# Patient Record
Sex: Female | Born: 1970 | Race: White | Hispanic: No | Marital: Married | Smoking: Never smoker
Health system: Southern US, Community
[De-identification: ages and names within clinical notes are randomized; demographics above are authoritative.]

## PROBLEM LIST (undated history)

## (undated) DIAGNOSIS — M797 Fibromyalgia: Secondary | ICD-10-CM

## (undated) DIAGNOSIS — N3010 Interstitial cystitis (chronic) without hematuria: Secondary | ICD-10-CM

## (undated) HISTORY — PX: DILATION AND CURETTAGE OF UTERUS: SHX78

---

## 1998-01-14 ENCOUNTER — Inpatient Hospital Stay (HOSPITAL_COMMUNITY): Admission: AD | Admit: 1998-01-14 | Discharge: 1998-01-16 | Payer: Self-pay | Admitting: Obstetrics and Gynecology

## 1999-12-02 ENCOUNTER — Encounter: Payer: Self-pay | Admitting: Obstetrics and Gynecology

## 1999-12-02 ENCOUNTER — Inpatient Hospital Stay (HOSPITAL_COMMUNITY): Admission: AD | Admit: 1999-12-02 | Discharge: 1999-12-02 | Payer: Self-pay

## 1999-12-06 ENCOUNTER — Ambulatory Visit (HOSPITAL_COMMUNITY): Admission: RE | Admit: 1999-12-06 | Discharge: 1999-12-06 | Payer: Self-pay | Admitting: Obstetrics & Gynecology

## 2000-04-09 ENCOUNTER — Other Ambulatory Visit: Admission: RE | Admit: 2000-04-09 | Discharge: 2000-04-09 | Payer: Self-pay | Admitting: Obstetrics & Gynecology

## 2000-06-21 ENCOUNTER — Inpatient Hospital Stay (HOSPITAL_COMMUNITY): Admission: AD | Admit: 2000-06-21 | Discharge: 2000-06-21 | Payer: Self-pay | Admitting: Obstetrics & Gynecology

## 2000-09-27 ENCOUNTER — Inpatient Hospital Stay (HOSPITAL_COMMUNITY): Admission: AD | Admit: 2000-09-27 | Discharge: 2000-09-29 | Payer: Self-pay | Admitting: Obstetrics & Gynecology

## 2000-09-27 ENCOUNTER — Encounter: Payer: Self-pay | Admitting: Obstetrics and Gynecology

## 2000-10-29 ENCOUNTER — Inpatient Hospital Stay (HOSPITAL_COMMUNITY): Admission: AD | Admit: 2000-10-29 | Discharge: 2000-10-29 | Payer: Self-pay | Admitting: Obstetrics and Gynecology

## 2000-10-30 ENCOUNTER — Inpatient Hospital Stay (HOSPITAL_COMMUNITY): Admission: AD | Admit: 2000-10-30 | Discharge: 2000-11-02 | Payer: Self-pay | Admitting: Obstetrics and Gynecology

## 2002-08-05 HISTORY — PX: CHOLECYSTECTOMY: SHX55

## 2003-08-06 HISTORY — PX: ABDOMINAL HYSTERECTOMY: SHX81

## 2005-10-27 ENCOUNTER — Ambulatory Visit: Payer: Self-pay | Admitting: Oncology

## 2005-11-25 ENCOUNTER — Ambulatory Visit (HOSPITAL_COMMUNITY): Admission: RE | Admit: 2005-11-25 | Discharge: 2005-11-25 | Payer: Self-pay | Admitting: Obstetrics and Gynecology

## 2005-12-20 ENCOUNTER — Ambulatory Visit: Payer: Self-pay | Admitting: Oncology

## 2006-01-01 LAB — CBC WITH DIFFERENTIAL/PLATELET
BASO%: 0.4 % (ref 0.0–2.0)
EOS%: 1.1 % (ref 0.0–7.0)
Eosinophils Absolute: 0 10*3/uL (ref 0.0–0.5)
HCT: 42.2 % (ref 34.8–46.6)
MCH: 30.6 pg (ref 26.0–34.0)
MONO#: 0.3 10*3/uL (ref 0.1–0.9)
MONO%: 6.5 % (ref 0.0–13.0)
RBC: 4.7 10*6/uL (ref 3.70–5.32)
WBC: 4.3 10*3/uL (ref 3.9–10.0)

## 2006-01-01 LAB — COMPREHENSIVE METABOLIC PANEL
Alkaline Phosphatase: 58 U/L (ref 39–117)
Calcium: 9.3 mg/dL (ref 8.4–10.5)
Chloride: 105 mEq/L (ref 96–112)
Creatinine, Ser: 0.8 mg/dL (ref 0.4–1.2)
Glucose, Bld: 94 mg/dL (ref 70–99)
Potassium: 3.8 mEq/L (ref 3.5–5.3)
Sodium: 141 mEq/L (ref 135–145)
Total Bilirubin: 0.4 mg/dL (ref 0.3–1.2)
Total Protein: 7.2 g/dL (ref 6.0–8.3)

## 2006-01-01 LAB — CHCC SMEAR

## 2006-01-24 ENCOUNTER — Ambulatory Visit (HOSPITAL_BASED_OUTPATIENT_CLINIC_OR_DEPARTMENT_OTHER): Admission: RE | Admit: 2006-01-24 | Discharge: 2006-01-24 | Payer: Self-pay | Admitting: Urology

## 2006-03-05 ENCOUNTER — Ambulatory Visit (HOSPITAL_COMMUNITY): Admission: RE | Admit: 2006-03-05 | Discharge: 2006-03-06 | Payer: Self-pay | Admitting: Urology

## 2006-09-01 ENCOUNTER — Ambulatory Visit: Payer: Self-pay | Admitting: Emergency Medicine

## 2006-09-01 ENCOUNTER — Inpatient Hospital Stay (HOSPITAL_COMMUNITY): Admission: EM | Admit: 2006-09-01 | Discharge: 2006-09-15 | Payer: Self-pay | Admitting: Urology

## 2006-09-04 ENCOUNTER — Ambulatory Visit: Payer: Self-pay | Admitting: Oncology

## 2006-09-12 ENCOUNTER — Encounter (INDEPENDENT_AMBULATORY_CARE_PROVIDER_SITE_OTHER): Payer: Self-pay | Admitting: *Deleted

## 2006-09-15 ENCOUNTER — Ambulatory Visit: Payer: Self-pay | Admitting: Gastroenterology

## 2006-09-16 ENCOUNTER — Ambulatory Visit: Payer: Self-pay | Admitting: Oncology

## 2006-09-23 LAB — PROTIME-INR: INR: 1.4 — ABNORMAL LOW (ref 2.00–3.50)

## 2006-09-23 LAB — COMPREHENSIVE METABOLIC PANEL
ALT: 47 U/L — ABNORMAL HIGH (ref 0–35)
AST: 34 U/L (ref 0–37)
Alkaline Phosphatase: 89 U/L (ref 39–117)
Calcium: 9.2 mg/dL (ref 8.4–10.5)
Chloride: 105 mEq/L (ref 96–112)
Glucose, Bld: 85 mg/dL (ref 70–99)
Total Protein: 6.5 g/dL (ref 6.0–8.3)

## 2006-09-23 LAB — CBC WITH DIFFERENTIAL/PLATELET
BASO%: 0.3 % (ref 0.0–2.0)
HCT: 31.3 % — ABNORMAL LOW (ref 34.8–46.6)
HGB: 10.7 g/dL — ABNORMAL LOW (ref 11.6–15.9)
MCHC: 34.1 g/dL (ref 32.0–36.0)
MCV: 93.4 fL (ref 81.0–101.0)
Platelets: 148 10*3/uL (ref 145–400)
RBC: 3.35 10*6/uL — ABNORMAL LOW (ref 3.70–5.32)
RDW: 14.8 % — ABNORMAL HIGH (ref 11.3–14.5)

## 2006-09-23 LAB — LACTATE DEHYDROGENASE: LDH: 323 U/L — ABNORMAL HIGH (ref 94–250)

## 2006-10-09 ENCOUNTER — Ambulatory Visit: Payer: Self-pay | Admitting: Oncology

## 2006-10-23 LAB — PROTHROMBIN TIME: INR: 2.6 — ABNORMAL HIGH (ref 0.0–1.5)

## 2006-10-23 LAB — CBC WITH DIFFERENTIAL/PLATELET
Eosinophils Absolute: 0.1 10*3/uL (ref 0.0–0.5)
HGB: 12.3 g/dL (ref 11.6–15.9)
MCH: 31.4 pg (ref 26.0–34.0)
MCHC: 34.7 g/dL (ref 32.0–36.0)
MCV: 90.5 fL (ref 81.0–101.0)
MONO#: 0.2 10*3/uL (ref 0.1–0.9)
NEUT#: 1 10*3/uL — ABNORMAL LOW (ref 1.5–6.5)
NEUT%: 41.8 % (ref 39.6–76.8)

## 2006-10-28 LAB — HYPERCOAGULABLE PANEL, COMPREHENSIVE
Anticardiolipin IgA: <7 [APL'U] (ref <13)
DRVVT 1:1 Mix: 38.9 secs (ref 36.1–47.0)
DRVVT: 61.2 secs — ABNORMAL HIGH (ref 36.1–47.0)
Homocysteine: 4.9 umol/L (ref 4.0–15.4)
Protein C Activity: <15 % (ref 91–147)
Protein C, Total: 37 % — ABNORMAL LOW (ref 70–140)
Protein S Activity: 25 % — ABNORMAL LOW (ref 81–180)
Protein S Ag, Total: 46 % — ABNORMAL LOW (ref 70–140)

## 2006-10-28 LAB — COMPREHENSIVE METABOLIC PANEL
AST: 15 U/L (ref 0–37)
Albumin: 4.2 g/dL (ref 3.5–5.2)
Alkaline Phosphatase: 84 U/L (ref 39–117)
CO2: 25 mEq/L (ref 19–32)
Potassium: 4.1 mEq/L (ref 3.5–5.3)
Total Bilirubin: 0.3 mg/dL (ref 0.3–1.2)
Total Protein: 6.3 g/dL (ref 6.0–8.3)

## 2006-11-20 ENCOUNTER — Ambulatory Visit: Payer: Self-pay | Admitting: Oncology

## 2006-11-25 LAB — CBC WITH DIFFERENTIAL/PLATELET
Basophils Absolute: 0 10*3/uL (ref 0.0–0.1)
EOS%: 0.8 % (ref 0.0–7.0)
HCT: 33.8 % — ABNORMAL LOW (ref 34.8–46.6)
HGB: 12 g/dL (ref 11.6–15.9)
LYMPH%: 39 % (ref 14.0–48.0)
MCH: 31.5 pg (ref 26.0–34.0)
MCV: 89 fL (ref 81.0–101.0)
NEUT%: 53.9 % (ref 39.6–76.8)
Platelets: 106 10*3/uL — ABNORMAL LOW (ref 145–400)
lymph#: 1.4 10*3/uL (ref 0.9–3.3)

## 2006-11-25 LAB — PROTIME-INR

## 2006-11-25 LAB — COMPREHENSIVE METABOLIC PANEL
ALT: 17 U/L (ref 0–35)
CO2: 26 mEq/L (ref 19–32)
Calcium: 9.3 mg/dL (ref 8.4–10.5)
Chloride: 108 mEq/L (ref 96–112)
Creatinine, Ser: 0.67 mg/dL (ref 0.40–1.20)
Glucose, Bld: 99 mg/dL (ref 70–99)
Total Protein: 6.5 g/dL (ref 6.0–8.3)

## 2006-12-03 ENCOUNTER — Ambulatory Visit: Admission: RE | Admit: 2006-12-03 | Discharge: 2006-12-03 | Payer: Self-pay | Admitting: Oncology

## 2006-12-03 ENCOUNTER — Ambulatory Visit: Payer: Self-pay | Admitting: Vascular Surgery

## 2006-12-03 ENCOUNTER — Encounter: Payer: Self-pay | Admitting: Vascular Surgery

## 2006-12-26 LAB — CBC WITH DIFFERENTIAL/PLATELET
Basophils Absolute: 0 10*3/uL (ref 0.0–0.1)
EOS%: 1.4 % (ref 0.0–7.0)
HCT: 35.4 % (ref 34.8–46.6)
HGB: 12.3 g/dL (ref 11.6–15.9)
MCH: 32.6 pg (ref 26.0–34.0)
MCV: 93.9 fL (ref 81.0–101.0)
MONO%: 6.3 % (ref 0.0–13.0)
NEUT%: 53.5 % (ref 39.6–76.8)

## 2006-12-26 LAB — COMPREHENSIVE METABOLIC PANEL
ALT: 13 U/L (ref 0–35)
Albumin: 4.3 g/dL (ref 3.5–5.2)
BUN: 16 mg/dL (ref 6–23)
CO2: 27 mEq/L (ref 19–32)
Calcium: 8.9 mg/dL (ref 8.4–10.5)
Chloride: 107 mEq/L (ref 96–112)
Creatinine, Ser: 0.69 mg/dL (ref 0.40–1.20)
Potassium: 4.9 mEq/L (ref 3.5–5.3)

## 2006-12-26 LAB — PROTIME-INR

## 2007-01-26 ENCOUNTER — Ambulatory Visit: Payer: Self-pay | Admitting: Oncology

## 2007-01-29 LAB — CBC WITH DIFFERENTIAL/PLATELET
BASO%: 0.3 % (ref 0.0–2.0)
EOS%: 2.2 % (ref 0.0–7.0)
HCT: 32.2 % — ABNORMAL LOW (ref 34.8–46.6)
MCHC: 35 g/dL (ref 32.0–36.0)
MONO#: 0.3 10*3/uL (ref 0.1–0.9)
NEUT%: 46 % (ref 39.6–76.8)
RBC: 3.47 10*6/uL — ABNORMAL LOW (ref 3.70–5.32)
RDW: 12.1 % (ref 11.3–14.5)
WBC: 3.4 10*3/uL — ABNORMAL LOW (ref 3.9–10.0)
lymph#: 1.5 10*3/uL (ref 0.9–3.3)

## 2007-01-29 LAB — COMPREHENSIVE METABOLIC PANEL
BUN: 13 mg/dL (ref 6–23)
CO2: 28 mEq/L (ref 19–32)
Calcium: 9 mg/dL (ref 8.4–10.5)
Chloride: 106 mEq/L (ref 96–112)
Creatinine, Ser: 0.67 mg/dL (ref 0.40–1.20)

## 2007-01-29 LAB — PROTIME-INR
INR: 1.8 — ABNORMAL LOW (ref 2.00–3.50)
Protime: 21.6 Seconds — ABNORMAL HIGH (ref 10.6–13.4)

## 2007-02-12 ENCOUNTER — Ambulatory Visit: Payer: Self-pay | Admitting: Oncology

## 2007-03-17 ENCOUNTER — Ambulatory Visit (HOSPITAL_COMMUNITY): Admission: RE | Admit: 2007-03-17 | Discharge: 2007-03-18 | Payer: Self-pay | Admitting: Urology

## 2007-05-13 ENCOUNTER — Ambulatory Visit: Payer: Self-pay | Admitting: Oncology

## 2007-05-15 LAB — PROTIME-INR

## 2007-05-15 LAB — CBC WITH DIFFERENTIAL/PLATELET
BASO%: 0.4 % (ref 0.0–2.0)
Basophils Absolute: 0 10*3/uL (ref 0.0–0.1)
Eosinophils Absolute: 0.1 10*3/uL (ref 0.0–0.5)
HCT: 39.2 % (ref 34.8–46.6)
HGB: 14 g/dL (ref 11.6–15.9)
MONO#: 0.3 10*3/uL (ref 0.1–0.9)
NEUT#: 1.8 10*3/uL (ref 1.5–6.5)
NEUT%: 46.5 % (ref 39.6–76.8)
WBC: 3.8 10*3/uL — ABNORMAL LOW (ref 3.9–10.0)
lymph#: 1.6 10*3/uL (ref 0.9–3.3)

## 2007-05-16 LAB — COMPREHENSIVE METABOLIC PANEL
ALT: 16 U/L (ref 0–35)
CO2: 27 mEq/L (ref 19–32)
Chloride: 103 mEq/L (ref 96–112)
Sodium: 141 mEq/L (ref 135–145)
Total Bilirubin: 0.3 mg/dL (ref 0.3–1.2)
Total Protein: 6.9 g/dL (ref 6.0–8.3)

## 2007-05-16 LAB — TSH: TSH: 0.923 u[IU]/mL (ref 0.350–5.500)

## 2007-07-10 ENCOUNTER — Ambulatory Visit: Payer: Self-pay | Admitting: Oncology

## 2007-09-29 ENCOUNTER — Ambulatory Visit (HOSPITAL_BASED_OUTPATIENT_CLINIC_OR_DEPARTMENT_OTHER): Admission: RE | Admit: 2007-09-29 | Discharge: 2007-09-29 | Payer: Self-pay | Admitting: Urology

## 2008-12-25 IMAGING — CR DG CHEST 1V PORT
1 series · 1 of 1 positions shown · non-contrast
Comparison: none

CLINICAL DATA: Shortness of breath, fever

[view not recorded]
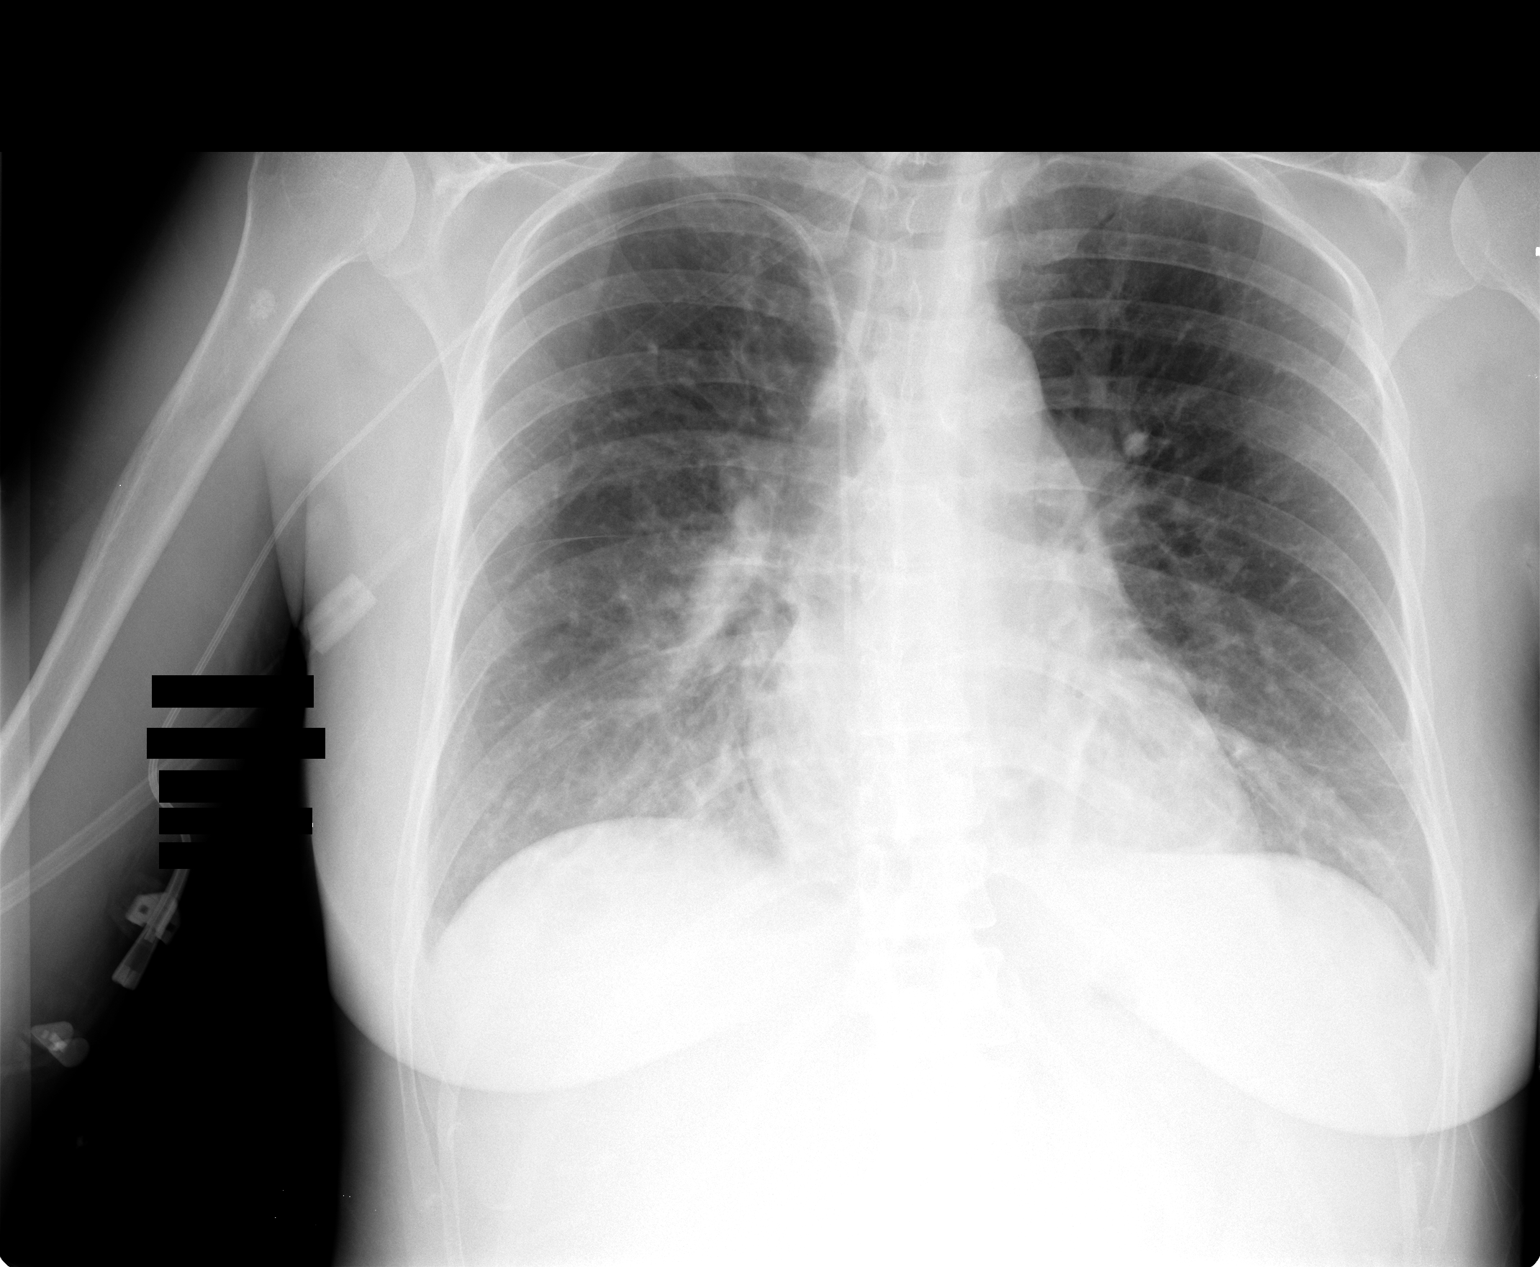

[1 of 1 positions shown; findings below may reference images not displayed]

Portable chest at 8468:

Comparison to previous day's exam. Right arm PICC stable. Some increase in
interstitial and air space opacities in the lung bases, right greater than left.
No definite effusion. Probable mild pulmonary vascular congestion. Heart size
remains normal. Vascular clips right upper abdomen.
IMPRESSION: 1. Worsening bibasilar infiltrates or edema, right greater than left.

## 2008-12-27 IMAGING — CR DG ABDOMEN ACUTE W/ 1V CHEST
3 series · 3 of 3 positions shown · non-contrast
Comparison: Chest x-ray 09/07/06.

CLINICAL DATA: Pelvic pain.
 ACUTE ABDOMINAL SERIES WITH CHEST - 3 VIEW:

[view not recorded (1 of 3)]
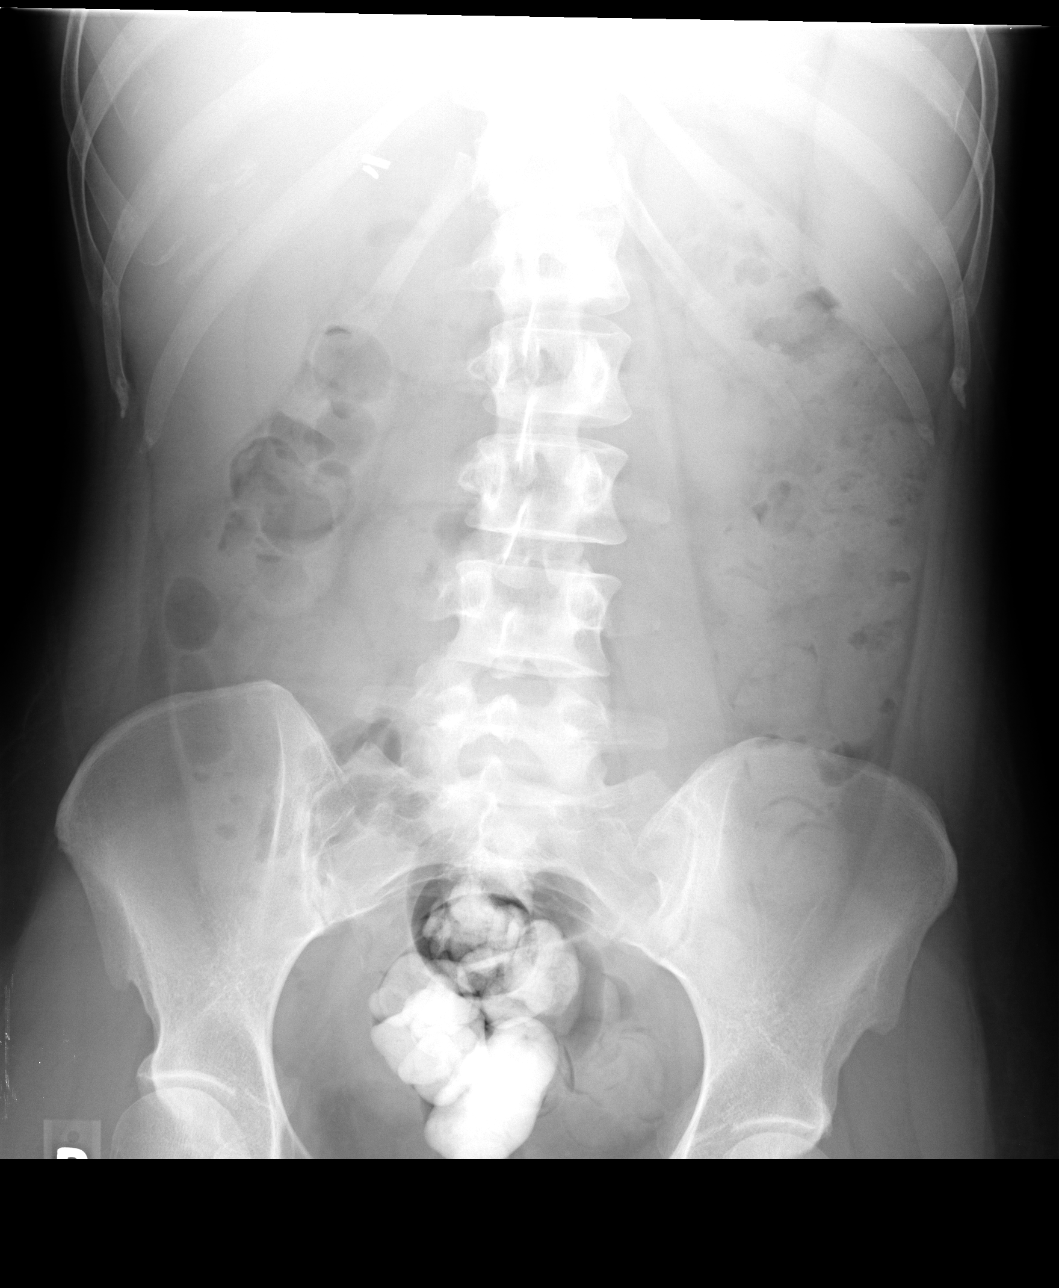

[view not recorded (2 of 3)]
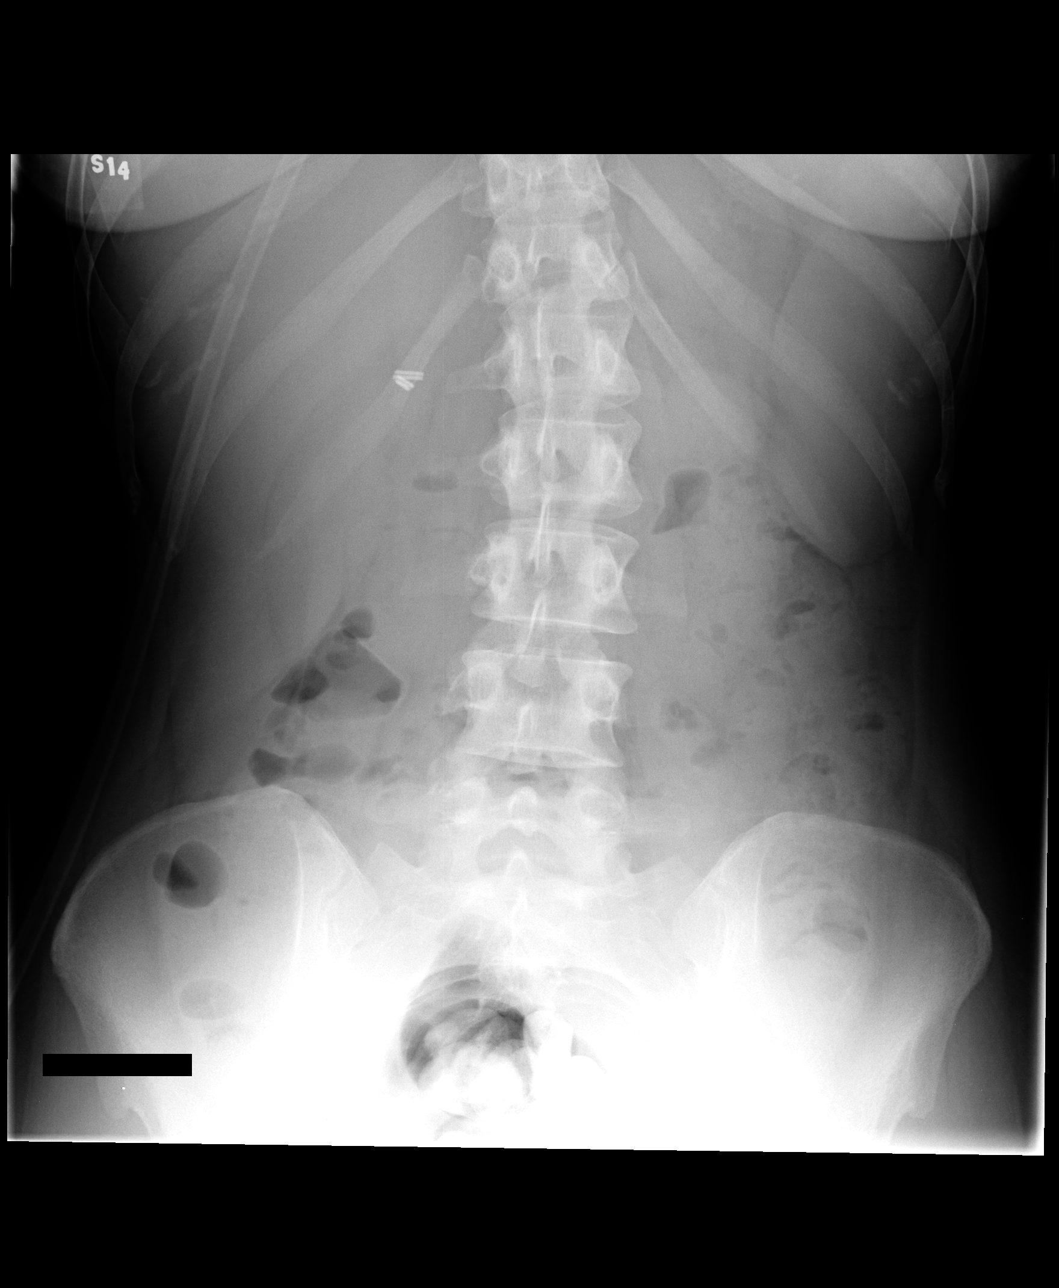

[view not recorded (3 of 3)]
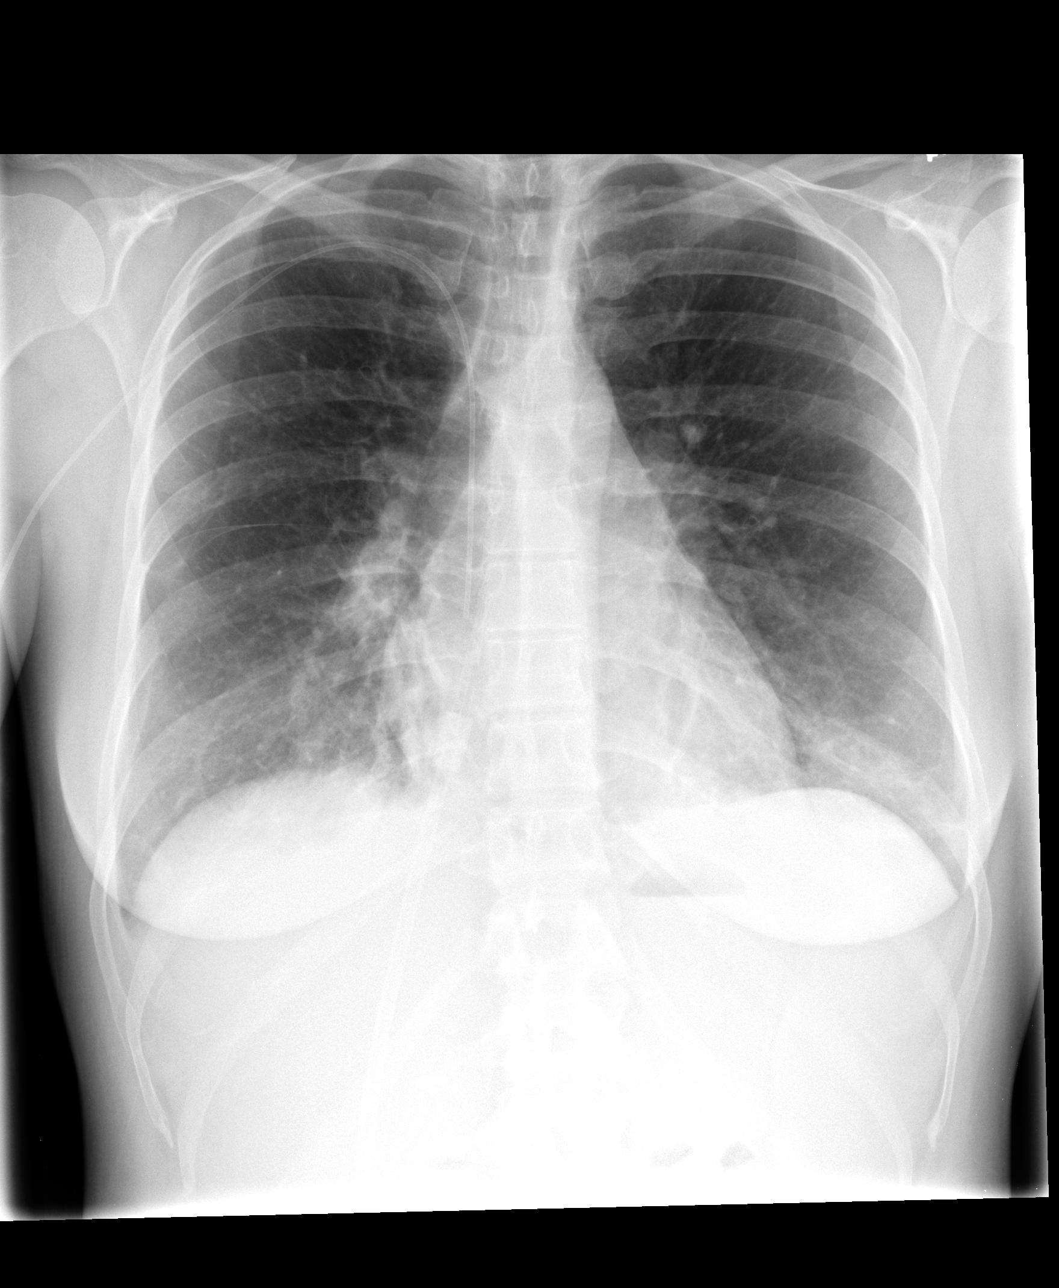

[3 of 3 positions shown; findings below may reference images not displayed]

FINDINGS: Bibasilar densities are still noted with perhaps slight interval improvement but little overall change.  A PICC line remains positioned near the cavoatrial junction. 
 No free air or specific abnormality of the bowel gas pattern. There is contrast in the rectum and sigmoid colon. This partially obscures an 18 mm bladder calculus.
 Psoas margins intact. Prior cholecystectomy.
IMPRESSION: 1.  Lung bases slightly improved. 
 2.  No acute or specific abdominal findings other than an 18 mm bladder calculus, noted on recent CT.

## 2008-12-29 IMAGING — CR DG CHEST 1V PORT
1 series · 1 of 1 positions shown · non-contrast
Comparison: 09/07/06.

CLINICAL DATA: Atelectasis.  Pelvic pain.
 PORTABLE CHEST - 1 VIEW - 09/11/06:

[view not recorded]
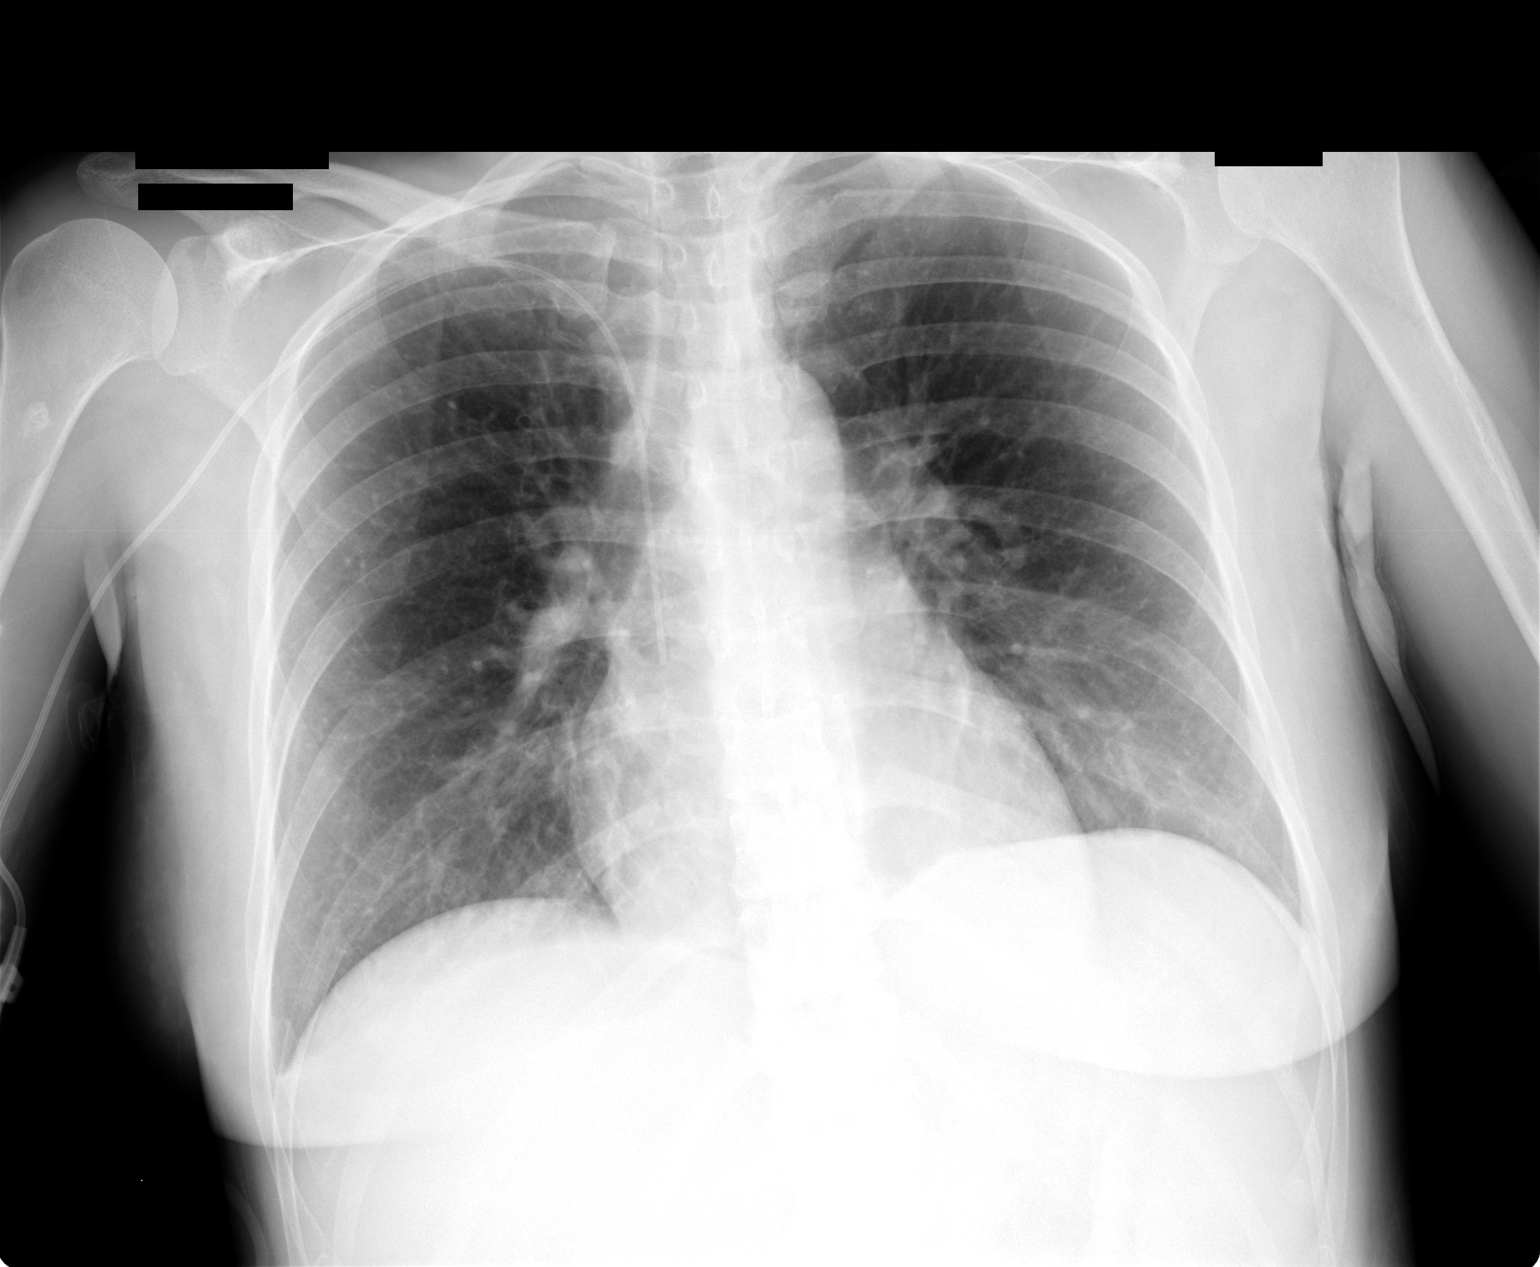

[1 of 1 positions shown; findings below may reference images not displayed]

FINDINGS: There is a right-sided PICC line with the tip in the SVC.  The heart size is normal.   There is no effusion or edema.  Atelectasis at both lung bases is improved in the interval.
IMPRESSION: 1.  Improved bibasilar atelectasis.
 2.  Stable right arm PICC line.

## 2009-08-05 HISTORY — PX: FACIAL FRACTURE SURGERY: SHX1570

## 2010-08-05 DIAGNOSIS — I2699 Other pulmonary embolism without acute cor pulmonale: Secondary | ICD-10-CM

## 2010-08-05 HISTORY — DX: Other pulmonary embolism without acute cor pulmonale: I26.99

## 2010-08-26 ENCOUNTER — Encounter (HOSPITAL_COMMUNITY): Payer: Self-pay | Admitting: Obstetrics and Gynecology

## 2010-12-18 NOTE — Op Note (Signed)
NAME:  Samantha [name], Samantha [name] NO.:  [id number]   MEDICAL RECORD NO.:  [id number]          PATIENT TYPE:  AMB   LOCATION:  [facility]                         FACILITY:  [facility]   PHYSICIAN:  Jamison Neighbor, M.D.  DATE OF BIRTH:  02-11-1971   DATE OF PROCEDURE:  09/29/2007  DATE OF DISCHARGE:                               OPERATIVE REPORT   PREOPERATIVE DIAGNOSIS:  Interstitial cystitis.   POSTOPERATIVE DIAGNOSIS:  Interstitial cystitis.   PROCEDURE:  Cystoscopy, urethral calibration, hydrodistention of  bladder, Marcaine and Pyridium installation, Marcaine and Kenalog  injection.   SURGEON:  Jamison Neighbor, M.D.   ANESTHESIA:  General.   COMPLICATIONS:  None.   DRAINS:  None.   BRIEF HISTORY:  This patient has end-stage interstitial cystitis with  severe and unrelenting pelvic pain.  The patient has had previous  hydrodistentions where she had lots of problems with stone formation.  She has undergone metabolic evaluation, has been on medication to try to  prevent stone formation.  She has also been on very aggressive medical  therapy but despite that has developed worsening problems with pelvic  floor dysfunction, urinary retention, chronic pain that is very poorly  controlled.  She has requested a repeat hydrodistention be performed.  She understands the risks and benefits of the procedure and gave full  informed consent.   PROCEDURE:  After successful induction of general anesthesia the patient  was placed in the dorsal lithotomy position, prepped with Betadine and  draped in the usual sterile fashion.  Bimanual examination revealed some  shortening of the vagina following previous hysterectomy.  There was not  much in the way of a cystocele.  There was a modest rectocele but  nothing that required therapy.  The urethra was calibrated to 32-French  with female urethral sounds with no signs of stenosis or stricture.  The  cystoscope was inserted.  The bladder was  carefully inspected.  No  tumors or stones could be seen.  Previous scarring from hydrodistention  and biopsies could be identified.  Hydrodistention of bladder was  performed.  The bladder was distended at a pressure of 100 cm of water  for five minutes.  When the bladder was drained, glomerulations could be  seen throughout the bladder was well as small ulcer formation.  Bladder  capacity little over 400 mL indicating marked decrease in the size of  the bladder.  The patient did not require a biopsy.  A mixture of Marcaine and  Pyridium was left in the bladder.  Marcaine and Kenalog were injected  periurethrally.  The patient tolerated the procedure well and was taken  to recovery room in good condition.      Jamison Neighbor, M.D.  Electronically Signed     RJE/MEDQ  D:  09/29/2007  T:  09/30/2007  Job:  [id number]

## 2010-12-18 NOTE — Op Note (Signed)
NAMEJANICE, [name] NO.:  [id number]   MEDICAL RECORD NO.:  [id number]          PATIENT TYPE:  OIB   LOCATION:  0098                         FACILITY:  [facility]   PHYSICIAN:  Jamison Neighbor, M.D.  DATE OF BIRTH:  1970-10-16   DATE OF PROCEDURE:  03/17/2007  DATE OF DISCHARGE:  03/18/2007                               OPERATIVE REPORT   PREOPERATIVE DIAGNOSIS:  1. Bladder calculi.  2. Bilateral flank pain with possible upper tract calculi.   POSTOPERATIVE DIAGNOSIS:  1. Bladder calculi.  2. Bilateral flank pain with possible upper tract calculi.   PROCEDURE:  Cystoscopy, bilateral retrogrades, cystolithalopaxy.   SURGEON:  Jamison Neighbor, M.D.   ANESTHESIA:  General.   COMPLICATIONS:  None.   DRAINS:  14-French Foley catheter.   BRIEF HISTORY:  This 40 year old female has a known bladder stone.  The  patient also has known interstitial cystitis along with numerous other  problems including fibromyalgia, irritable bowel syndrome and a recent  problem with deep venous thrombosis.  The patient has been on Coumadin  until just recently when it was stopped and we made arrangements for her  to have a scheduled procedure to remove her bladder stone.  The patient  called stating that she was developing upper tract pain and passing  stones and she thought that she might have stones coming from the  kidneys. For that reason, she is to undergo cystoscopy, bilateral  retrogrades, and at the same time undergo removal of the bladder  calculus.  She understands the risks and benefits of the procedure and  gave full informed consent.   DESCRIPTION OF PROCEDURE:  After successful induction of general  anesthesia, the patient was placed in the dorsal lithotomy position,  prepped with Betadine, and draped in the usual sterile fashion.  The  cystoscope was inserted.  The urethra was found to be of normal size  with no signs of stenosis or stricture.  The bladder was  carefully  inspected.  The large bladder stone was identified.  No other stones  could be seen.  There was nothing coming out of the ureters that was  irregular. The only positive findings was that the bladder mucosa had an  unremarkable appearance across its base due to the stone having sat in  that area for several months.  Bilateral retrogrades were then  performed.   On the right hand side, a ureteral catheter was inserted and a  retrograde was shot.  The ureters and collecting system was slightly  dilated but there was absolutely no filling defect and this appears to  be a variation of normal.  The drain out films taken later on were  unremarkable.  It was clear there was no tumor, stone or any  obstruction.  Attention was then directed the left hand side.  On the  left hand side, a ureteral catheter was also inserted.  This also showed  a slightly dilated system but there was no obstruction, no irregularity  on the drain out, and no sign of stone in the upper tract.  The patient  will be advised  that these were normal upper tracts.   Following completion, the patient underwent cystolithalopaxy using the  laser fiber. The stone was very slowly fragmented into small pieces all  of which were aspirated out of the bladder.  At the end of the  procedure, the only irregular finding at the bladder base was the area  that was somewhat denuded from the fact that the patient has had a stone  sitting in that area for several months. The remainder of the bladder  was absolutely normal.  There was no signs of other stone material in  the bladder and some of the previous adhered stone that had been seen at  prior cystoscopic examination was not present.  Great care was taken to  avoid over distending the bladder for fear of flaring up her known  interstitial cystitis and that problem did not occur.  The patient had a  Foley catheter inserted.  This was done in order to help the patient and   she is going to be allowed to stay overnight.  She will be discharged in  the morning.  The patient tolerated the procedure and was taken to  recovery in good condition.      Jamison Neighbor, M.D.  Electronically Signed     RJE/MEDQ  D:  03/17/2007  T:  03/19/2007  Job:  [id number]

## 2010-12-21 NOTE — Op Note (Signed)
[facility]  Patient:    Samantha [name], Samantha [name]                   MRN: [id number] Proc. Date: 12/06/99 Adm. Date:  [id number] Disc. Date: [id number] Attending:  Trevor Iha                           Operative Report  PREOPERATIVE DIAGNOSIS:  Missed abortion.  POSTOPERATIVE DIAGNOSIS:  Missed abortion.  OPERATIVE PROCEDURE:  Dilatation and evacuation.  SURGEON:  Freddy Finner, M.D.  ANESTHESIA:  General.  INTRAOPERATIVE COMPLICATIONS:  None.  ESTIMATED INTRAOPERATIVE BLOOD LOSS:  Less than or equal to 50 cc.  The patients blood type is known to be A-positive.  INDICATIONS:  The patient is a 40 year old white married female, gravida 2, para 1, who had some spotting and had an ultrasound approximately six days prior to this admission, which showed a viable fetus with a fetal heart rate of 108.  She presented with recurrent bleeding on the day prior to this surgery, at which time an ultrasound showed a nonviable pregnancy, with no evidence of fetal heart, and without even definite evidence of a fetal pole at that point.  She is now admitted for a D&E.  DESCRIPTION OF PROCEDURE:  She was admitted on the morning of surgery and brought to the operating room, and placed under adequate general anesthesia, placed in he dorsal lithotomy position.  A Betadine prep of the perineum and vagina was carried out.  A bivalve Graves speculum was introduced.  The cervix was grasped with the single tooth tenaculum and progressively dilated to 25 with Pratts.  A 7.0 mm curved suction cannula was introduced and aspiration produced obvious products f conception.  Gentle curettage was continued until it was felt that the cavity was completely evacuated.  This was confirmed by a repeat vacuum aspiration.  The patient tolerated the operative procedure well.  All instruments were removed. She was awakened and taken to the recovery room in good condition. DD:   12/06/99 TD:  12/06/99 Job: [id number]

## 2010-12-21 NOTE — Discharge Summary (Signed)
NAMEMarland Kitchen  Samantha [name], Samantha [name] NO.:  [id number]   MEDICAL RECORD NO.:  [id number]          PATIENT TYPE:  INP   LOCATION:  1432                         FACILITY:  [facility]   PHYSICIAN:  Jamison Neighbor, M.D.  DATE OF BIRTH:  [birth year]   DATE OF ADMISSION:  09/01/2006  DATE OF DISCHARGE:  09/15/2006                               DISCHARGE SUMMARY   DISCHARGE DIAGNOSES:  1. Interstitial cystitis.  2. Pulmonary embolism with infarction.  3. Pancytopenia.  4. Vulvodynia.  5. Irritable bowel syndrome.  6. Splenomegaly.  7. Migraine headaches.  8. Hypoxemia.  9. Bacteremia due to group D strep.   HISTORY:  This 40 year old female is known to have interstitial  cystitis.  The patient was diagnosed by cystoscopy and hydrodistention  which confirmed that diagnosis.  Since that time, the patient has had  multiple issues.  At first, she had some problems with bladder calculi  formation and required additional surgeries for removal of the stones  plus fulguration.  It has been extremely difficult to keep the patient's  pain under control.  The patient is being admitted for pain control and  additional evaluation.   The patient is known to have irritable bowel syndrome, esophageal reflux  disease and also a past history of endometriosis.  There is also vulvar  pain.  Previous surgery includes hydrodistention, removal of bladder  calculi and also hysterectomy.  The patient social history is  unremarkable.  She does not use tobacco or alcohol.  Her initial  physical examination is delineated in the initial history and physical.   HOSPITAL COURSE:  The patient had a very complicated hospital situation.  She was first placed on round-the-clock pain medication plus a Foley  catheter irrigation of the bladder.  In order to ensure that there was  not something else going on aside from the interstitial cystitis, a CT  scan was obtained and this showed evidence of a pulmonary embolus.   The  etiology of this was unclear as the patient had no clinical signs of  lower extremity clot.  She ended up being evaluated further with a  formal chest CT which confirmed the diagnosis of PE and also with a  venous Doppler which was unremarkable.  The patient continued on her  pain medication which included a PCA, Toradol and Lyrica along with her  bladder installation.  Her laboratory studies were normal with the  exception of a low total protein.  The patient was started on  anticoagulation therapy and hematology/oncology was consulted because  there was a question about pancytopenia.  It was noted that there was a  decrease in her platelet count.  We also noted that she had some  splenomegaly.  They consulted her and we also had pulmonary consult with  the patient in order to see if they had any information as to why the  patient might have had a PE.  Hematology was concerned as to that she  might have HIT.  Eventually, they did agree to go ahead and perform a  bone marrow aspirate which came back negative.  The patient did  have a  temperature spike to 102 degrees.  She was on Levaquin for what was  thought to be an enterococcus urinary tract infection.  Blood cultures  were negative.  Etiology of this CT scan was never completely clear.   The temperature spike was thought to possibly be due to pneumonia.  It  should be noted during the evaluation it was found that the patient did  have a bladder stone.  The patient did develop some peripheral edema  that required treatment with Lasix.   Pulmonary and occupational therapy consuls were obtained to see if the  patient could improve in her ambulation.  The patient was very slowly  mobilized and was eventually ready for discharge.   By the time the patient was ready, her situation was as follows:  1. There were still some questions as to why the patient had a      pulmonary embolism in whether she actually had an issue with her       coagulation status.  2. There were certainly some concerns about the infiltrates seen on CT      and why she had a pulmonary embolus.  3. The patient's splenomegaly and elevated liver function tests were      unclear and that certainly may be due to medical therapy.  4. Obviously, it is very difficult to control the patient's pelvic      pain.  5. The patient was clearly deconditioned.   The GI consultation suggested that the patient really did not have a  primary liver disease and did not feel she had any significance other  than irritable bowel syndrome.   The patient will continue anticoagulation for a full 6 months.  We will  maintain her on her interstitial cystitis therapy for her bladder which  will consist of installation therapy, oral therapy, etc.  She is to  return to see me in the off in two week's time.           ______________________________  Jamison Neighbor, M.D.  Electronically Signed     RJE/MEDQ  D:  10/18/2006  T:  10/19/2006  Job:  [id number]

## 2010-12-21 NOTE — Op Note (Signed)
NAMEMarland Kitchen  BUSHRA, DENMAN NO.:  [id number]   MEDICAL RECORD NO.:  [id number]          PATIENT TYPE:  INP   LOCATION:  1610                         [facility]ACILITY:  [facility]   PHYSICIAN:  Jamison Neighbor, M.D.  DATE OF BIRTH:  05-Oct-1970   DATE OF PROCEDURE:  03/05/2006  DATE OF DISCHARGE:  03/06/2006                                 OPERATIVE REPORT   PREOPERATIVE DIAGNOSIS:  Multiple bladder calculi, status post recent  cystoscopy.   POSTOPERATIVE DIAGNOSIS:  Multiple bladder calculi, status post recent  cystoscopy.   PROCEDURE:  Removal of multiple bladder calculi with fulguration of bladder  ulcer.   SURGEON:  Jamison Neighbor, M.D.   ANESTHESIA:  General.   COMPLICATIONS:  None.   DRAINS:  None.   BRIEF HISTORY:  This 40 year old female underwent a recent cystoscopy and  hydrodistention for evaluation of what was presumed to be interstitial  cystitis.  She was found to have a small bladder capacity consistent with  IC.  The patient began to develop some problems with hematuria and some  passing calculi.  The patient underwent a cystoscopic examination in the  office, and multiple bladder calculi were seen adhered to a lot of little,  small spots all over the bladder.  There were at least a dozen small stones  within the bladder which are really bothering the patient.  She is now to  undergo removal of the calculi with fulguration of the ulcerated areas, if  possible.  She understands the risks and benefits of proceeding and gave  full, informed consent.   PROCEDURE:  After successful induction of general anesthesia, the patient  was placed in the dorsal lithotomy position, prepped with Betadine, and  draped in the usual sterile fashion.  The cystoscope was inserted, and the  bladder was carefully inspected.  Stones were seen scattered throughout the  bladder, actually embedded into the mucosa where the area was healing.  It  is certainly possible that where  these raw areas developed following the  recent cystoscopy that the patient developed calculi, perhaps due to a  previously undetermined high lithogenic potential within the urine.  These  stones were all individually grasped and removed, and all the grit was then  washed out of the bladder as much as possible.  Areas that were raw and  bleeding were fulgurated.  The patient tolerated the procedure well and was  taken to the recovery room in good condition.  While we are waiting to get a  24-hour urine study, we are going to empirically place her on  hydrochlorothiazide, allopurinol, and Urocit 10 to try and prevent  stone formation, if at all possible, but the patient will be advised that  she certainly is at some risk for stone formation until the bladder has  fully healed.  The patient will continue on her other medications and will  also be given doxycycline.           ______________________________  Jamison Neighbor, M.D.  Electronically Signed     RJE/MEDQ  D:  03/05/2006  T:  03/06/2006  Job:  [id number]

## 2010-12-21 NOTE — Op Note (Signed)
NAMEMarland Kitchen  Samantha, [name] NO.:  [id number]   MEDICAL RECORD NO.:  [id number]          PATIENT TYPE:  AMB   LOCATION:  [facility]                         FACILITY:  [facility]   PHYSICIAN:  Jamison Neighbor, M.D.  DATE OF BIRTH:  Sep 18, 1970   DATE OF PROCEDURE:  01/24/2006  DATE OF DISCHARGE:                                 OPERATIVE REPORT   PREOPERATIVE DIAGNOSES:  Interstitial cystitis/painful bladder syndrome.   POSTOPERATIVE DIAGNOSES:  Interstitial cystitis/painful bladder syndrome.   PROCEDURE:  Cystoscopy, urethral calibration, hydro-distention of the  bladder, Marcaine and Pyridium installation, Marcaine and Kenalog injection.   SURGEON:  Jamison Neighbor, M.D.   ANESTHESIA:  General.   COMPLICATIONS:  None.   DRAINS:  None.   INDICATIONS FOR PROCEDURE:  This is a 40 year old female who has had  problems with chronic lower urinary tract symptoms and pelvic pain.  She is  status post a hysterectomy for endometriosis, but did not have any real  improvement in her pelvic pain.  For that reason she is undergoing a  diagnostic cystoscopy to determine if she might have interstitial cystitis.  The patient understands the risks and benefits of the procedure.  She is  fully aware of the fact that there is no guaranty that this will give her  improvement in her symptoms but it is hoped that she may be one of that  percentage of patients who does improve with hydrodistention, even if only  temporarily.  The patient is aware that this is being primarily done for  diagnostic purposes.  She gave a full informed consent.   DESCRIPTION OF PROCEDURE:  After a successful induction of general  anesthesia, the patient was placed in the dorsal lithotomy position and  prepped with Betadine and draped in the usual sterile fashion.  The patient  underwent a bimanual examination.  She has good support for the vaginal  vault.  There is no cystocele, rectocele or enterocele.  There are  no  masses.  On bimanual examination the urethra was palpably normal with no  signs of a diverticulum.  The cystoscope was inserted.  The bladder was  carefully inspected.  It was free of any tumors or stones.  Both orifices  were normal in configuration and location.  At one point there had been a  question on a CT scan from [facility] that she might have air, and I  carefully dilated the bladder to make sure there was no evidence of a  fistula and nothing was identified.  The bladder was distended at a pressure  of 170 mm of water for five minutes.  When the bladder was drained, the  patient had classic glomerulizations throughout the bladder, consistent with  interstitial cystitis.  The bladder capacity was diminished with a total  volume of 650 mL, which is a little bit larger than the average IC bladder  of 575 mL, but significantly smaller than a normal bladder capacity in a  female of her age, which is 1150 mL.  The patient did not have any Hunner's  ulcers, but did have the classic glomerulizations consistent with  interstitial cystitis.  Biopsy was not indicated.  The bladder was drained.  A mixture of Marcaine and Pyridium was left in the bladder.  Marcaine and  Kenalog were injected peri-urethrally.  The patient received an  intraoperative B&O suppository as well as Toradol and Zofran.   She will be sent home with a prescription for Lorcet plus, Pyridium plus and  doxycycline.  She will return to see [place] in followup in two to three weeks'  time.  At that point, we will start her on installation therapy as well as  oral therapy and hopefully get her interstitial cystitis under reasonable  control.           ______________________________  Jamison Neighbor, M.D.  Electronically Signed     RJE/MEDQ  D:  01/24/2006  T:  01/24/2006  Job:  [id number]   cc:   Zelphia Cairo, MD  Fax: [phone]

## 2010-12-21 NOTE — Consult Note (Signed)
NAMEMarland Kitchen  Samantha [name], Samantha [name]            ACCOUNT NO.:  [id number]   MEDICAL RECORD NO.:  [id number]          PATIENT TYPE:  INP   LOCATION:  1432                         FACILITY:  [facility]   PHYSICIAN:  Firas N. Shadad        DATE OF BIRTH:  1970/11/09   DATE OF CONSULTATION:  09/04/2006  DATE OF DISCHARGE:                                 CONSULTATION   REASON FOR CONSULTATION:  Thrombocytopenia, pulmonary embolus, low white  cell counts, and splenomegaly.   HISTORY OF PRESENT ILLNESS:  This is a very pleasant 40 year old female  who lives in [place], whom I have seen once in consultation back in May  2007.  Samantha [name] is a very pleasant, however, unfortunate female with  chronic history of interstitial cystitis, irritable bowel syndrome, and  recurrent endometriosis.  She has had multiple hospitalizations in the  past for flares of endometriosis as well as gastroenteritis as well as  interstitial cystitis.  When I saw her back in May 2007, the reason was  for thrombocytopenia.  At that time, she has had a recent  hospitalization in March 2007, with episode of gastroenteritis and led  to a hospitalization in [place], and her platelet counts at the time of  admission were 32 and subsequently dropped to 70,000.  The patient was  seen at that time by Dr. Rennis Harding at [facility] for  thrombocytopenia and felt it was a rather benign finding.  The patient  never really followed up with him and ended up seeing me in clinic back  in May 2007.  At that time, her platelet count at [facility] here in [place], her platelet count was 154,000.  Her  peripheral smear at that time did not show any abnormalities findings,  did not show any histocytes or evidence of xerocytosis or any abnormal  findings.  In looking back at her history, she had a fluctuation in her  platelet counts dating back to at least March 2002 where her platelet  counts ranged between the 70,000  and 80,000 range.  Her drop in her  platelet counts have always been correlated with an acute illness and a  hospitalization every time.  This time around, the patient presented to  Dr. Logan Bores for chronic pelvic pain and more flare of her interstitial  cystitis as well as multiple bladder caliculi and ended up being  hospitalized on January 28 by Dr. Logan Bores for pain management at that  time.  Upon admission, she was noted to have her platelets to be at  70,000 and subsequently __________ dropped to 54,000 on January 31.  Her  total white cell count was also at 2.2., her hemoglobin 11.2.  The  patient also had a CT scan of the abdomen and pelvis on January 28 and  had an incidental finding of a probable right lower pulmonary embolus.  They are also describing in her CT scan a borderline enlarged spleen  that has been stable compared to a scan on April 2007.  The patient  subsequently started on heparin later on September 02, 2006.  Since that  time, she has been receiving heparin as mentioned.  The patient control  analgesia utilizing Dilaudid.  Overall, Samantha [name] feels relatively  okay, has not really reported any bleeding anywhere and did not report  any hematuria or hematochezia, did not report any hemoptysis or  hematemesis.   REVIEW OF SYSTEMS:  Other than the pelvic pain, as mentioned above, it  was unremarkable.   PAST MEDICAL HISTORY:  1. Irritable bowel syndrome.  2. History of endometriosis.  3. She is also status post cholecystectomy, hysterectomy, and      bilateral salpingo-oophorectomy.  4. There is no history of any blood clots or any bleeding disorders.   FAMILY HISTORY:  Significant for endometriosis, also hypertension.  Grandmother has breast cancer.   SOCIAL HISTORY:  She is married, stay at home mother.  She has 3  children.   MEDICATIONS AS AN OUTPATIENT:  She is on Bentyl, Prevacid, Zofran, and  Phenergan.   MEDICATION IN THE HOSPITAL:  Ciprofloxacin, Valium,  heparin drip,  Dilaudid drip, Atarax, Toradol, Pyridium, Lyrica, Bentyl, Zofran, and  Ditropan   ALLERGIES:  DEMEROL, MORPHINE, ERYTHROMYCIN.   PHYSICAL EXAMINATION:  GENERAL:  Alert, awake female, did not appear in  any active distress.  VITAL SIGNS:  Blood pressure 129/79, pulse 87, respirations 18,  saturating 99% on room air.  Her T-max is 100.8.  HEENT:  Head is normocephalic, atraumatic.  Pupils equal, round, and  reactive to light.  Mucous membranes moist and pink.  NECK:  Supple, no lymphadenopathy.  HEART:  Regular rate and rhythm, S1 and S2.  LUNGS:  Clear to auscultation.  No rhonchi or wheezes.  ABDOMEN:  Soft.  I could not appreciate any splenomegaly.  EXTREMITIES:  No clubbing, cyanosis, or edema.  NEUROLOGIC:  Intact.   LABORATORY DATA:  Hemoglobin 10.8, white cells 2.4, platelet count  54,000.  Differential is pending.  Her PTT is 15.3.  INR is 1.3.  Her  heparin level is 0.36.   ASSESSMENT/PLAN:  This is a 40 year old female with the following  issues:  1. History of interstitial cystitis.  2. 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  3. Leukopenia.  4. Acute pulmonary embolus incidentally found.  5. Borderline splenomegaly.   RECOMMENDATION AND PLAN:  I think from a thrombocytopenia standpoint  again, as mentioned, her platelets have been up and down since 2002 and,  again, it could be related to her illness.  I think her thrombocytopenia  has been related to her acute illness.  Also, she could have an element  of immune thrombocytopenia.  It possible that her immune  thrombocytopenia is related to her increased spleen size.  I doubt that  she has a primary hematological problem; however, I am not ruling this  out completely at this point.  So, to work this up, I will obtain a repeat CBC with differential at this time with a peripheral smear which  I will  review.  I do agree with a disseminated intravascular coagulation  panel as well as a HIT panel; however, I think both of those are  extremely unlikely.  In this setting, it is possible that she has a low-  grade disseminated intravascular coagulation that is manifesting with a  clot rather than a bleed.  I think a disseminated intravascular  coagulation panel would clarify that.  Overall, I think her picture is  rather  complex.  For the time being, I do agree with continuing heparin  at this point given the fact it is really manifesting itself with more  clotting than bleeding problem.  I will continue to watch her daily CBC  at this point.           ______________________________  Blenda Nicely. [facility]  Electronically Signed     FNS/MEDQ  D:  09/04/2006  T:  09/04/2006  Job:  [id number]

## 2010-12-21 NOTE — H&P (Signed)
NAMEMarland Kitchen  Samantha [name], Samantha [name] NO.:  [id number]   MEDICAL RECORD NO.:  [id number]          PATIENT TYPE:  INP   LOCATION:  1432                         FACILITY:  [facility]   PHYSICIAN:  Jamison Neighbor, M.D.  DATE OF BIRTH:  Feb 06, 1971   DATE OF ADMISSION:  09/01/2006  DATE OF DISCHARGE:                              HISTORY & PHYSICAL   SERVICE:  Urology.   ADMITTING DIAGNOSIS:  1. End-stage interstitial cystitis.  2. Uncontrolled chronic pelvic pain.   HISTORY:  This is a 40 year old female who is known to have interstitial  cystitis.  In June of last year, the patient underwent cystoscopy and  hydrodistention of the bladder which confirmed the diagnosis of  interstitial cystitis.  The patient developed severe problems with stone  formation along all of the raw surface of the bladder had to undergo  additional surgery for removal of multiple bladder calculi and  fulguration of bladder ulcers.  Since that time, it has been almost  impossible to keep her pain under control.  She has had intermittent  problems with irregular urinalyses and severe pelvic pain.  She has been  placed on medication to try and control her stone formation, and that  has improved her overall situation, but it has become extremely  difficult to control her pain.  The patient is not a candidate for  repeat hydrodistention as she would be at risk for repeat stone  formation.  She is now to be admitted for pain control and additional  evaluation.   The patient's past medical history is also remarkable for irritable  bowel syndrome and problems with gastritis.  She also has a history  endometriosis for which she underwent hysterectomy.  The problem, of  course, is that the combination of endometriosis, irritable bowel  syndrome and interstitial cystitis has caused severe and chronic pelvic  pain that has been very difficult to control necessitating the need for  hospitalization.   PREVIOUS  SURGERY:  Includes the hysterectomy in 2006, the initial  hydrodistention in 2007 and the removal of bladder calculi later in  2007.   The patient's family history is unremarkable.  She does not use tobacco.  She does not use alcohol.  She has an extremely supportive husband and  family.   FAMILY HISTORY AND SOCIAL HISTORY AND REVIEW OF SYSTEMS:  Noncontributory.   PHYSICAL EXAMINATION:  GENERAL:  The patient is a well-developed, well-  nourished female with severe pelvic pain.  HEENT:  Normocephalic, atraumatic.  Cranial nerves II-XII are grossly  intact.  NECK:  Supple.  No adenopathy or thyromegaly.  LUNGS:  Clear.  HEART:  Had a regular rate and rhythm.  No murmurs, thrills, gallops,  rubs, or heaves.  ABDOMEN:  Soft but very tender across the pubic bone.  PELVIS:  There is a lot of pelvic floor dysfunction for which she has  been treated with a physical therapist as well as home inferential  stimulator.  She does not have cystocele or rectocele.  There are no  masses on bimanual exam.  The bladder is very tender.  EXTREMITIES:  Have no  cyanosis, clubbing, or edema.   IMPRESSION:  1. History of endometriosis.  2. Irritable bowel syndrome.  3. Interstitial cystitis.  4. History of bladder calculi.  5. Severe chronic pelvic pain.   PLAN:  Admit for IV pain management, epidural anesthesia, and additional  evaluation.           ______________________________  Jamison Neighbor, M.D.  Electronically Signed     RJE/MEDQ  D:  09/01/2006  T:  09/02/2006  Job:  [id number]

## 2010-12-21 NOTE — Op Note (Signed)
NAMEMarland Kitchen  [name], [name]            ACCOUNT NO.:  [id number]   MEDICAL RECORD NO.:  [id number]          PATIENT TYPE:  INP   LOCATION:  1432                         [facility]AC[facility]   PHYSICIAN:  Firas N. Shadad        DATE OF BIRTH:  03-27-1971   DATE OF PROCEDURE:  09/12/2006  DATE OF DISCHARGE:                               OPERATIVE REPORT   DESCRIPTION OF PROCEDURE:  Bone marrow aspirate and biopsy.   INDICATION:  Ms. Samantha [name] is a 40 year old female with  thrombocytopenia, leukopenia, and splenomegaly.  A bone marrow biopsy is  to rule out a lympho or myeloproliferative disorder.   The patient was prepped and draped in a sterile fashion and placed in  the decubitus position, exposing her left iliac crest.  The skin was  prepped and draped using Betadine.  Skin was anesthetized using 2%  lidocaine as well as the periosteum.  The aspirate needle was used to  obtain aspirate without any difficulty.  The fluid was sent for  cytogenetics and flow cytometry.  Attempted biopsy was aborted due to  the patient's intolerance and pain.  There were no bleeding  complications.  The patient the procedure overall well.           ______________________________  Blenda Nicely. [facility]  Electronically Signed     FNS/MEDQ  D:  09/12/2006  T:  09/12/2006  Job:  [id number]

## 2011-04-26 LAB — I-STAT 8, (EC8 V) (CONVERTED LAB)
BUN: 18
Bicarbonate: 29.1 — ABNORMAL HIGH
Glucose, Bld: 91
HCT: 48 — ABNORMAL HIGH
Hemoglobin: 16.3 — ABNORMAL HIGH
Operator id: 114531
Potassium: 3.5
Sodium: 140
TCO2: 31

## 2011-05-20 LAB — HEMOGLOBIN AND HEMATOCRIT, BLOOD: HCT: 34.4 — ABNORMAL LOW

## 2012-08-05 HISTORY — PX: SHOULDER ARTHROSCOPY: SHX128

## 2021-12-12 ENCOUNTER — Other Ambulatory Visit: Payer: Self-pay | Admitting: Neurological Surgery

## 2021-12-13 NOTE — Pre-Procedure Instructions (Signed)
Surgical Instructions ? ? ? Your procedure is scheduled on Tuesday, May 16th. ? Report to [facility] Main Entrance "A" at 05:30 A.M., then check in with the Admitting office. ? Call this number if you have problems the morning of surgery: ? [phone] ? ? If you have any questions prior to your surgery date call [phone]: Open Monday-Friday 8am-4pm ? ? ? Remember: ? Do not eat after midnight the night before your surgery ? ?You may drink clear liquids until 04:30 AM the morning of your surgery.   ?Clear liquids allowed are: Water, Non-Citrus Juices (without pulp), Carbonated Beverages, Clear Tea, Black Coffee Only (NO MILK, CREAM OR POWDERED CREAMER of any kind), and Gatorade. ?  ? Take these medicines the morning of surgery with A SIP OF WATER  ?gabapentin (NEURONTIN)  ?oxymetazoline (AFRIN) 0.05 % nasal spray- if needed ? ? ?As of today, STOP taking any Aspirin (unless otherwise instructed by your surgeon) Aleve, Naproxen, Ibuprofen, Motrin, Advil, Goody's, BC's, all herbal medications, fish oil, and all vitamins. ?         ?           ?Do NOT Smoke (Tobacco/Vaping) for 24 hours prior to your procedure. ? ?If you use a CPAP at night, you may bring your mask/headgear for your overnight stay. ?  ?Contacts, glasses, piercing's, hearing aid's, dentures or partials may not be worn into surgery, please bring cases for these belongings.  ?  ?For patients admitted to the hospital, discharge time will be determined by your treatment team. ?  ?Patients discharged the day of surgery will not be allowed to drive home, and someone needs to stay with them for 24 hours. ? ?SURGICAL WAITING ROOM VISITATION ?Patients having surgery or a procedure may have two support people in the waiting room. These visitors may be switched out with other visitors if needed. ?Children under the age of 42 must have an adult accompany them who is not the patient. ?If the patient needs to stay at the hospital during part of their recovery, the  visitor guidelines for inpatient rooms apply. ? ?Please refer to the [place] website for the visitor guidelines for Inpatients (after your surgery is over and you are in a regular room).  ? ? ?Special instructions:   ?[facility]- Preparing For Surgery ? ?Before surgery, you can play an important role. Because skin is not sterile, your skin needs to be as free of germs as possible. You can reduce the number of germs on your skin by washing with CHG (chlorahexidine gluconate) Soap before surgery.  CHG is an antiseptic cleaner which kills germs and bonds with the skin to continue killing germs even after washing.   ? ?Oral Hygiene is also important to reduce your risk of infection.  Remember - BRUSH YOUR TEETH THE MORNING OF SURGERY WITH YOUR REGULAR TOOTHPASTE ? ?Please do not use if you have an allergy to CHG or antibacterial soaps. If your skin becomes reddened/irritated stop using the CHG.  ?Do not shave (including legs and underarms) for at least 48 hours prior to first CHG shower. It is OK to shave your face. ? ?Please follow these instructions carefully. ?  ?Shower the NIGHT BEFORE SURGERY and the MORNING OF SURGERY ? ?If you chose to wash your hair, wash your hair first as usual with your normal shampoo. ? ?After you shampoo, rinse your hair and body thoroughly to remove the shampoo. ? ?Use CHG Soap as you would any other liquid soap.  You can apply CHG directly to the skin and wash gently with a scrungie or a clean washcloth.  ? ?Apply the CHG Soap to your body ONLY FROM THE NECK DOWN.  Do not use on open wounds or open sores. Avoid contact with your eyes, ears, mouth and genitals (private parts). Wash Face and genitals (private parts)  with your normal soap.  ? ?Wash thoroughly, paying special attention to the area where your surgery will be performed. ? ?Thoroughly rinse your body with warm water from the neck down. ? ?DO NOT shower/wash with your normal soap after using and rinsing off the CHG  Soap. ? ?Pat yourself dry with a CLEAN TOWEL. ? ?Wear CLEAN PAJAMAS to bed the night before surgery ? ?Place CLEAN SHEETS on your bed the night before your surgery ? ?DO NOT SLEEP WITH PETS. ? ? ?Day of Surgery: ?Take a shower with CHG soap. ?Do not wear jewelry or makeup ?Do not wear lotions, powders, perfumes, or deodorant. ?Do not shave 48 hours prior to surgery.   ?Do not bring valuables to the hospital.  ?[facility] is not responsible for any belongings or valuables. ?Do not wear nail polish, gel polish, artificial nails, or any other type of covering on natural nails (fingers and toes) ?If you have artificial nails or gel coating that need to be removed by a nail salon, please have this removed prior to surgery. Artificial nails or gel coating may interfere with anesthesia's ability to adequately monitor your vital signs. ?Wear Clean/Comfortable clothing the morning of surgery ?Remember to brush your teeth WITH YOUR REGULAR TOOTHPASTE. ?  ?Please read over the following fact sheets that you were given. ? ? ? ?If you received a COVID test during your pre-op visit  it is requested that you wear a mask when out in public, stay away from anyone that may not be feeling well and notify your surgeon if you develop symptoms. If you have been in contact with anyone that has tested positive in the last 10 days please notify you surgeon.  ?

## 2021-12-14 ENCOUNTER — Encounter (HOSPITAL_COMMUNITY)
Admission: RE | Admit: 2021-12-14 | Discharge: 2021-12-14 | Disposition: A | Discharge status: Home / Self Care | Payer: Medicaid Other | Source: Ambulatory Visit | Attending: Neurological Surgery | Admitting: Neurological Surgery

## 2021-12-14 ENCOUNTER — Other Ambulatory Visit: Payer: Self-pay

## 2021-12-14 ENCOUNTER — Encounter (HOSPITAL_COMMUNITY): Payer: Self-pay | Admitting: *Deleted

## 2021-12-14 VITALS — BP 135/96 | HR 88 | Temp 98.1°F | Resp 17 | Ht 62.0 in | Wt 199.7 lb

## 2021-12-14 DIAGNOSIS — Z01812 Encounter for preprocedural laboratory examination: Secondary | ICD-10-CM | POA: Diagnosis present

## 2021-12-14 DIAGNOSIS — Z01818 Encounter for other preprocedural examination: Secondary | ICD-10-CM

## 2021-12-14 HISTORY — DX: Fibromyalgia: M79.7

## 2021-12-14 HISTORY — DX: Interstitial cystitis (chronic) without hematuria: N30.10

## 2021-12-14 LAB — CBC
HCT: 46.1 % — ABNORMAL HIGH (ref 36.0–46.0)
Hemoglobin: 15.8 g/dL — ABNORMAL HIGH (ref 12.0–15.0)
MCH: 30.9 pg (ref 26.0–34.0)
MCHC: 34.3 g/dL (ref 30.0–36.0)
MCV: 90 fL (ref 80.0–100.0)
Platelets: 180 10*3/uL (ref 150–400)
RBC: 5.12 MIL/uL — ABNORMAL HIGH (ref 3.87–5.11)
RDW: 12.2 % (ref 11.5–15.5)
WBC: 7.1 10*3/uL (ref 4.0–10.5)
nRBC: 0 % (ref 0.0–0.2)

## 2021-12-14 LAB — SURGICAL PCR SCREEN
MRSA, PCR: NEGATIVE
Staphylococcus aureus: NEGATIVE

## 2021-12-14 NOTE — Progress Notes (Signed)
PCP - Dr. Luna Kitchens ?Cardiologist - denies ? ?PPM/ICD - denies ? ? ?Chest x-ray - 03/18/2007 ?EKG - denies ?Stress Test - denies ?ECHO - denies ?Cardiac Cath - denies ? ?Sleep Study - denies ? ? ?DM- denies ? ?ASA/Blood Thinner Instructions: n/a ? ? ?ERAS Protcol - yes, no drink ? ? ?COVID TEST- n/a ? ? ?Anesthesia review: no ? ?Patient denies shortness of breath, fever, cough and chest pain at PAT appointment ? ? ?All instructions explained to the patient, with a verbal understanding of the material. Patient agrees to go over the instructions while at home for a better understanding. Patient also instructed to notify surgeon of any contact with COVID+ person or if she develops any symptoms. The opportunity to ask questions was provided. ?  ?

## 2021-12-17 ENCOUNTER — Other Ambulatory Visit: Payer: Self-pay | Admitting: Neurological Surgery

## 2021-12-18 ENCOUNTER — Encounter (HOSPITAL_COMMUNITY): Payer: Self-pay | Admitting: Neurological Surgery

## 2021-12-18 ENCOUNTER — Ambulatory Visit (HOSPITAL_COMMUNITY)
Admission: RE | Admit: 2021-12-18 | Discharge: 2021-12-18 | Disposition: A | Discharge status: Home / Self Care | Payer: Medicaid Other | Attending: Neurological Surgery | Admitting: Neurological Surgery

## 2021-12-18 ENCOUNTER — Ambulatory Visit (HOSPITAL_COMMUNITY): Payer: Medicaid Other | Admitting: Vascular Surgery

## 2021-12-18 ENCOUNTER — Other Ambulatory Visit: Payer: Self-pay

## 2021-12-18 ENCOUNTER — Ambulatory Visit (HOSPITAL_BASED_OUTPATIENT_CLINIC_OR_DEPARTMENT_OTHER): Payer: Medicaid Other | Admitting: Certified Registered"

## 2021-12-18 ENCOUNTER — Encounter (HOSPITAL_COMMUNITY)
Admission: RE | Disposition: A | Discharge status: Home / Self Care | Payer: Self-pay | Source: Home / Self Care | Attending: Neurological Surgery

## 2021-12-18 ENCOUNTER — Ambulatory Visit (HOSPITAL_COMMUNITY): Payer: Medicaid Other

## 2021-12-18 DIAGNOSIS — E669 Obesity, unspecified: Secondary | ICD-10-CM | POA: Insufficient documentation

## 2021-12-18 DIAGNOSIS — M797 Fibromyalgia: Secondary | ICD-10-CM | POA: Diagnosis not present

## 2021-12-18 DIAGNOSIS — M5116 Intervertebral disc disorders with radiculopathy, lumbar region: Secondary | ICD-10-CM

## 2021-12-18 DIAGNOSIS — Z6836 Body mass index (BMI) 36.0-36.9, adult: Secondary | ICD-10-CM | POA: Insufficient documentation

## 2021-12-18 DIAGNOSIS — M9983 Other biomechanical lesions of lumbar region: Secondary | ICD-10-CM | POA: Diagnosis not present

## 2021-12-18 HISTORY — PX: LUMBAR LAMINECTOMY/ DECOMPRESSION WITH MET-RX: SHX5959

## 2021-12-18 SURGERY — LUMBAR LAMINECTOMY/ DECOMPRESSION WITH MET-RX
Anesthesia: General | Site: Spine Lumbar | Laterality: Right

## 2021-12-18 MED ORDER — DEXAMETHASONE SODIUM PHOSPHATE 10 MG/ML IJ SOLN
INTRAMUSCULAR | Status: AC
  Filled 2021-12-18: qty 1

## 2021-12-18 MED ORDER — CEFAZOLIN SODIUM-DEXTROSE 2-4 GM/100ML-% IV SOLN
INTRAVENOUS | Status: AC
  Filled 2021-12-18: qty 100

## 2021-12-18 MED ORDER — SCOPOLAMINE 1 MG/3DAYS TD PT72
1.0000 | MEDICATED_PATCH | Freq: Once | TRANSDERMAL | Status: DC
  Administered 2021-12-18: 1.5 mg via TRANSDERMAL
  Filled 2021-12-18: qty 1

## 2021-12-18 MED ORDER — CEFAZOLIN SODIUM-DEXTROSE 2-4 GM/100ML-% IV SOLN
2.0000 g | INTRAVENOUS | Status: AC
Start: 2021-12-18 — End: 2021-12-18
  Administered 2021-12-18: 2 g via INTRAVENOUS

## 2021-12-18 MED ORDER — LIDOCAINE 2% (20 MG/ML) 5 ML SYRINGE
INTRAMUSCULAR | Status: AC
  Filled 2021-12-18: qty 5

## 2021-12-18 MED ORDER — CHLORHEXIDINE GLUCONATE CLOTH 2 % EX PADS: 6.0000 | MEDICATED_PAD | Freq: Once | CUTANEOUS | Status: DC

## 2021-12-18 MED ORDER — FENTANYL CITRATE (PF) 100 MCG/2ML IJ SOLN
INTRAMUSCULAR | Status: AC
  Filled 2021-12-18: qty 2

## 2021-12-18 MED ORDER — FENTANYL CITRATE (PF) 250 MCG/5ML IJ SOLN
INTRAMUSCULAR | Status: AC
  Filled 2021-12-18: qty 5

## 2021-12-18 MED ORDER — METHOCARBAMOL 500 MG PO TABS: 500.0000 mg | ORAL_TABLET | Freq: Four times a day (QID) | ORAL | 1 refills | Status: AC | PRN

## 2021-12-18 MED ORDER — PROPOFOL 10 MG/ML IV BOLUS
INTRAVENOUS | Status: AC
  Filled 2021-12-18: qty 20

## 2021-12-18 MED ORDER — BUPIVACAINE-EPINEPHRINE (PF) 0.5% -1:200000 IJ SOLN
INTRAMUSCULAR | Status: DC | PRN
  Administered 2021-12-18: 10 mL

## 2021-12-18 MED ORDER — 0.9 % SODIUM CHLORIDE (POUR BTL) OPTIME
TOPICAL | Status: DC | PRN
  Administered 2021-12-18: 1000 mL

## 2021-12-18 MED ORDER — HEMOSTATIC AGENTS (NO CHARGE) OPTIME
TOPICAL | Status: DC | PRN
  Administered 2021-12-18: 1 via TOPICAL

## 2021-12-18 MED ORDER — LACTATED RINGERS IV SOLN: INTRAVENOUS | Status: DC

## 2021-12-18 MED ORDER — DEXAMETHASONE SODIUM PHOSPHATE 10 MG/ML IJ SOLN
INTRAMUSCULAR | Status: DC | PRN
  Administered 2021-12-18: 10 mg via INTRAVENOUS

## 2021-12-18 MED ORDER — PROPOFOL 10 MG/ML IV BOLUS
INTRAVENOUS | Status: DC | PRN
  Administered 2021-12-18: 160 mg via INTRAVENOUS

## 2021-12-18 MED ORDER — PHENYLEPHRINE 80 MCG/ML (10ML) SYRINGE FOR IV PUSH (FOR BLOOD PRESSURE SUPPORT)
PREFILLED_SYRINGE | INTRAVENOUS | Status: DC | PRN
  Administered 2021-12-18 (×2): 80 ug via INTRAVENOUS

## 2021-12-18 MED ORDER — GELATIN ABSORBABLE MT POWD: OROMUCOSAL | Status: DC | PRN

## 2021-12-18 MED ORDER — ACETAMINOPHEN 500 MG PO TABS: 1000.0000 mg | ORAL_TABLET | Freq: Once | ORAL | Status: AC

## 2021-12-18 MED ORDER — ORAL CARE MOUTH RINSE: 15.0000 mL | Freq: Once | OROMUCOSAL | Status: AC

## 2021-12-18 MED ORDER — ONDANSETRON HCL 4 MG/2ML IJ SOLN
INTRAMUSCULAR | Status: AC
  Filled 2021-12-18: qty 2

## 2021-12-18 MED ORDER — SUGAMMADEX SODIUM 200 MG/2ML IV SOLN
INTRAVENOUS | Status: DC | PRN
  Administered 2021-12-18: 400 mg via INTRAVENOUS

## 2021-12-18 MED ORDER — METHYLPREDNISOLONE ACETATE 80 MG/ML IJ SUSP
INTRAMUSCULAR | Status: AC
  Filled 2021-12-18: qty 1

## 2021-12-18 MED ORDER — ROCURONIUM BROMIDE 10 MG/ML (PF) SYRINGE
PREFILLED_SYRINGE | INTRAVENOUS | Status: AC
  Filled 2021-12-18: qty 10

## 2021-12-18 MED ORDER — PROPOFOL 500 MG/50ML IV EMUL
INTRAVENOUS | Status: DC | PRN
  Administered 2021-12-18: 25 ug/kg/min via INTRAVENOUS

## 2021-12-18 MED ORDER — LIDOCAINE-EPINEPHRINE 1 %-1:100000 IJ SOLN
INTRAMUSCULAR | Status: AC
  Filled 2021-12-18: qty 1

## 2021-12-18 MED ORDER — ROCURONIUM BROMIDE 10 MG/ML (PF) SYRINGE
PREFILLED_SYRINGE | INTRAVENOUS | Status: DC | PRN
  Administered 2021-12-18 (×2): 10 mg via INTRAVENOUS
  Administered 2021-12-18: 60 mg via INTRAVENOUS

## 2021-12-18 MED ORDER — LIDOCAINE-EPINEPHRINE 1 %-1:100000 IJ SOLN
INTRAMUSCULAR | Status: DC | PRN
  Administered 2021-12-18: 10 mL

## 2021-12-18 MED ORDER — THROMBIN 5000 UNITS EX SOLR
CUTANEOUS | Status: AC
  Filled 2021-12-18: qty 5000

## 2021-12-18 MED ORDER — LIDOCAINE 2% (20 MG/ML) 5 ML SYRINGE
INTRAMUSCULAR | Status: DC | PRN
  Administered 2021-12-18: 100 mg via INTRAVENOUS

## 2021-12-18 MED ORDER — HYDROCODONE-ACETAMINOPHEN 5-325 MG PO TABS: 2.0000 | ORAL_TABLET | Freq: Four times a day (QID) | ORAL | 0 refills | Status: AC | PRN

## 2021-12-18 MED ORDER — FENTANYL CITRATE (PF) 250 MCG/5ML IJ SOLN
INTRAMUSCULAR | Status: DC | PRN
  Administered 2021-12-18: 100 ug via INTRAVENOUS

## 2021-12-18 MED ORDER — MIDAZOLAM HCL 2 MG/2ML IJ SOLN
INTRAMUSCULAR | Status: AC
  Filled 2021-12-18: qty 2

## 2021-12-18 MED ORDER — ACETAMINOPHEN 500 MG PO TABS
ORAL_TABLET | ORAL | Status: AC
  Administered 2021-12-18: 1000 mg via ORAL
  Filled 2021-12-18: qty 2

## 2021-12-18 MED ORDER — DIPHENHYDRAMINE HCL 50 MG/ML IJ SOLN
INTRAMUSCULAR | Status: DC | PRN
  Administered 2021-12-18: 12.5 mg via INTRAVENOUS

## 2021-12-18 MED ORDER — ONDANSETRON HCL 4 MG/2ML IJ SOLN
INTRAMUSCULAR | Status: DC | PRN
  Administered 2021-12-18: 4 mg via INTRAVENOUS

## 2021-12-18 MED ORDER — CHLORHEXIDINE GLUCONATE 0.12 % MT SOLN
OROMUCOSAL | Status: AC
  Administered 2021-12-18: 15 mL via OROMUCOSAL
  Filled 2021-12-18: qty 15

## 2021-12-18 MED ORDER — BUPIVACAINE-EPINEPHRINE 0.5% -1:200000 IJ SOLN
INTRAMUSCULAR | Status: AC
  Filled 2021-12-18: qty 1

## 2021-12-18 MED ORDER — CHLORHEXIDINE GLUCONATE 0.12 % MT SOLN: 15.0000 mL | Freq: Once | OROMUCOSAL | Status: AC

## 2021-12-18 MED ORDER — PHENYLEPHRINE HCL-NACL 20-0.9 MG/250ML-% IV SOLN
INTRAVENOUS | Status: DC | PRN
  Administered 2021-12-18: 30 ug/min via INTRAVENOUS

## 2021-12-18 MED ORDER — ONDANSETRON HCL 4 MG/2ML IJ SOLN: 4.0000 mg | Freq: Once | INTRAMUSCULAR | Status: DC | PRN

## 2021-12-18 MED ORDER — FENTANYL CITRATE (PF) 100 MCG/2ML IJ SOLN
25.0000 ug | INTRAMUSCULAR | Status: DC | PRN
  Administered 2021-12-18: 50 ug via INTRAVENOUS

## 2021-12-18 MED ORDER — FENTANYL CITRATE (PF) 100 MCG/2ML IJ SOLN
INTRAMUSCULAR | Status: DC | PRN
  Administered 2021-12-18: 50 ug via INTRAVENOUS

## 2021-12-18 MED ORDER — METHYLPREDNISOLONE ACETATE 80 MG/ML IJ SUSP
INTRAMUSCULAR | Status: DC | PRN
  Administered 2021-12-18: 40 mg

## 2021-12-18 MED ORDER — MIDAZOLAM HCL 2 MG/2ML IJ SOLN
INTRAMUSCULAR | Status: DC | PRN
  Administered 2021-12-18: 2 mg via INTRAVENOUS

## 2021-12-18 SURGICAL SUPPLY — 70 items
ADH SKN CLS APL DERMABOND .7 (GAUZE/BANDAGES/DRESSINGS) ×1
BAG COUNTER SPONGE SURGICOUNT (BAG) ×3 IMPLANT
BAG SPNG CNTER NS LX DISP (BAG) ×1
BAND INSRT 18 STRL LF DISP RB (MISCELLANEOUS) ×2
BAND RUBBER #18 3X1/16 STRL (MISCELLANEOUS) ×4 IMPLANT
BLADE SURG 11 STRL SS (BLADE) ×2 IMPLANT
BUR MATCHSTICK NEURO 3.0 LAGG (BURR) ×2 IMPLANT
CNTNR URN SCR LID CUP LEK RST (MISCELLANEOUS) IMPLANT
CONT SPEC 4OZ STRL OR WHT (MISCELLANEOUS)
COVER BACK TABLE 60X90IN (DRAPES) ×2 IMPLANT
COVER MAYO STAND STRL (DRAPES) ×1 IMPLANT
DECANTER SPIKE VIAL GLASS SM (MISCELLANEOUS) ×1 IMPLANT
DERMABOND ADVANCED (GAUZE/BANDAGES/DRESSINGS) ×1
DERMABOND ADVANCED .7 DNX12 (GAUZE/BANDAGES/DRESSINGS) ×1 IMPLANT
DRAIN JACKSON RD 7FR 3/32 (WOUND CARE) IMPLANT
DRAPE C-ARM 42X72 X-RAY (DRAPES) ×3 IMPLANT
DRAPE LAPAROTOMY 100X72X124 (DRAPES) ×2 IMPLANT
DRAPE MICROSCOPE LEICA (MISCELLANEOUS) ×2 IMPLANT
DRAPE SURG 17X23 STRL (DRAPES) ×3 IMPLANT
DRSG OPSITE POSTOP 3X4 (GAUZE/BANDAGES/DRESSINGS) ×3 IMPLANT
DURAPREP 26ML APPLICATOR (WOUND CARE) ×2 IMPLANT
ELECT BLADE 6.5 EXT (BLADE) ×2 IMPLANT
ELECT BLADE INSULATED 6.5IN (ELECTROSURGICAL) ×2
ELECT REM PT RETURN 9FT ADLT (ELECTROSURGICAL) ×2
ELECTRODE BLDE INSULATED 6.5IN (ELECTROSURGICAL) ×1 IMPLANT
ELECTRODE REM PT RTRN 9FT ADLT (ELECTROSURGICAL) ×1 IMPLANT
GAUZE 4X4 16PLY ~~LOC~~+RFID DBL (SPONGE) IMPLANT
GLOVE BIO SURGEON STRL SZ8 (GLOVE) ×3 IMPLANT
GLOVE BIOGEL PI IND STRL 7.5 (GLOVE) IMPLANT
GLOVE BIOGEL PI IND STRL 8 (GLOVE) ×4 IMPLANT
GLOVE BIOGEL PI INDICATOR 7.5 (GLOVE) ×1
GLOVE BIOGEL PI INDICATOR 8 (GLOVE) ×3
GLOVE ECLIPSE 7.0 STRL STRAW (GLOVE) ×3 IMPLANT
GLOVE ECLIPSE 8.0 STRL XLNG CF (GLOVE) ×4 IMPLANT
GLOVE SURG ENC MOIS LTX SZ8 (GLOVE) ×2 IMPLANT
GLOVE SURG UNDER POLY LF SZ8.5 (GLOVE) ×3 IMPLANT
GOWN STRL REUS W/ TWL LRG LVL3 (GOWN DISPOSABLE) IMPLANT
GOWN STRL REUS W/ TWL XL LVL3 (GOWN DISPOSABLE) ×2 IMPLANT
GOWN STRL REUS W/TWL 2XL LVL3 (GOWN DISPOSABLE) IMPLANT
GOWN STRL REUS W/TWL LRG LVL3 (GOWN DISPOSABLE)
GOWN STRL REUS W/TWL XL LVL3 (GOWN DISPOSABLE) ×4
HEMOSTAT POWDER KIT SURGIFOAM (HEMOSTASIS) ×2 IMPLANT
KIT BASIN OR (CUSTOM PROCEDURE TRAY) ×2 IMPLANT
KIT POSITION SURG JACKSON T1 (MISCELLANEOUS) ×2 IMPLANT
KIT TURNOVER KIT B (KITS) ×2 IMPLANT
MARKER SKIN DUAL TIP RULER LAB (MISCELLANEOUS) ×2 IMPLANT
NDL HYPO 18GX1.5 BLUNT FILL (NEEDLE) IMPLANT
NDL HYPO 25X1 1.5 SAFETY (NEEDLE) ×1 IMPLANT
NDL SPNL 18GX3.5 QUINCKE PK (NEEDLE) ×1 IMPLANT
NEEDLE HYPO 18GX1.5 BLUNT FILL (NEEDLE) ×2 IMPLANT
NEEDLE HYPO 25X1 1.5 SAFETY (NEEDLE) ×2 IMPLANT
NEEDLE SPNL 18GX3.5 QUINCKE PK (NEEDLE) ×2 IMPLANT
NS IRRIG 1000ML POUR BTL (IV SOLUTION) ×2 IMPLANT
PACK LAMINECTOMY NEURO (CUSTOM PROCEDURE TRAY) ×2 IMPLANT
PAD ARMBOARD 7.5X6 YLW CONV (MISCELLANEOUS) ×6 IMPLANT
PATTIES SURGICAL .5 X.5 (GAUZE/BANDAGES/DRESSINGS) IMPLANT
PATTIES SURGICAL .5 X1 (DISPOSABLE) IMPLANT
PATTIES SURGICAL 1X1 (DISPOSABLE) IMPLANT
SPONGE SURGIFOAM ABS GEL 12-7 (HEMOSTASIS) ×2 IMPLANT
SPONGE T-LAP 4X18 ~~LOC~~+RFID (SPONGE) IMPLANT
STAPLER VISISTAT 35W (STAPLE) IMPLANT
SUT VIC AB 0 CT1 27 (SUTURE)
SUT VIC AB 0 CT1 27XBRD ANBCTR (SUTURE) IMPLANT
SUT VIC AB 2-0 CP2 18 (SUTURE) ×2 IMPLANT
SUT VIC AB 3-0 SH 8-18 (SUTURE) ×2 IMPLANT
SYR 3ML LL SCALE MARK (SYRINGE) ×1 IMPLANT
TOWEL GREEN STERILE (TOWEL DISPOSABLE) IMPLANT
TOWEL GREEN STERILE FF (TOWEL DISPOSABLE) IMPLANT
TRAY FOLEY MTR SLVR 16FR STAT (SET/KITS/TRAYS/PACK) IMPLANT
WATER STERILE IRR 1000ML POUR (IV SOLUTION) ×2 IMPLANT

## 2021-12-18 NOTE — Transfer of Care (Signed)
Immediate Anesthesia Transfer of Care Note ? ?Patient: Samantha [name] ? ?Procedure(s) Performed: MINIMALLY INVASIVE  FAR LATERAL MICRODISCECTOMY, LUMBAR FOUR- FIVE, W/METRX RIGHT (Right: Spine Lumbar) ? ?Patient Location: PACU ? ?Anesthesia Type:General ? ?Level of Consciousness: awake, alert  and drowsy ? ?Airway & Oxygen Therapy: Patient Spontanous Breathing and Patient connected to nasal cannula oxygen ? ?Post-op Assessment: Report given to RN, Post -op Vital signs reviewed and stable and Patient moving all extremities X 4 ? ?Post vital signs: Reviewed and stable ? ?Last Vitals:  ?Vitals Value Taken Time  ?BP 117/90 12/18/21 1000  ?Temp 36.6 ?C 12/18/21 1000  ?Pulse 112 12/18/21 1002  ?Resp 33 12/18/21 1002  ?SpO2 97 % 12/18/21 1002  ?Vitals shown include unvalidated device data. ? ?Last Pain:  ?Vitals:  ? 12/18/21 0634  ?TempSrc:   ?PainSc: 6   ?   ? ?  ? ?Complications: No notable events documented. ?

## 2021-12-18 NOTE — Anesthesia Procedure Notes (Signed)
Procedure Name: Intubation ?Date/Time: 12/18/2021 7:53 AM ?Performed by: Gaylene Brooks, CRNA ?Pre-anesthesia Checklist: Patient identified, Emergency Drugs available, Suction available and Patient being monitored ?Patient Re-evaluated:Patient Re-evaluated prior to induction ?Oxygen Delivery Method: Circle System Utilized ?Preoxygenation: Pre-oxygenation with 100% oxygen ?Induction Type: IV induction ?Ventilation: Mask ventilation without difficulty ?Laryngoscope Size: Sabra Heck and 2 ?Grade View: Grade II ?Tube type: Oral ?Tube size: 7.0 mm ?Number of attempts: 2 ?Airway Equipment and Method: Stylet and Oral airway ?Placement Confirmation: ETT inserted through vocal cords under direct vision, positive ETCO2 and breath sounds checked- equal and bilateral ?Secured at: 23 cm ?Tube secured with: Tape ?Dental Injury: Teeth and Oropharynx as per pre-operative assessment  ?Comments: DL with MIller 2. Grade 2 view. ETT in esophagus. Quickly removed. 2nd DL with Miller 2. Grade 2 view. ETT passed through cords. After intubation, small injury noted to upper gum. Minimal bleeding noted.  ? ? ? ? ?

## 2021-12-18 NOTE — Anesthesia Preprocedure Evaluation (Addendum)
Anesthesia Evaluation  ?Patient identified by MRN, date of birth, ID band ?Patient awake ? ? ? ?Reviewed: ?Allergy & Precautions, NPO status , Patient's Chart, lab work & pertinent test results ? ?History of Anesthesia Complications ?(+) PONV and history of anesthetic complications ? ?Airway ?Mallampati: II ? ?TM Distance: >3 FB ?Neck ROM: Full ? ? ? Dental ? ?(+) Dental Advisory Given, Partial Upper ?  ?Pulmonary ?neg pulmonary ROS,  ?  ?Pulmonary exam normal ?breath sounds clear to auscultation ? ? ? ? ? ? Cardiovascular ?Exercise Tolerance: Good ?negative cardio ROS ?Normal cardiovascular exam ?Rhythm:Regular Rate:Normal ? ? ?  ?Neuro/Psych ? HERNIATED NUCLEUS PULPOSUS, LUMBAR ?  ? GI/Hepatic ?negative GI ROS, Neg liver ROS,   ?Endo/Other  ?Obesity ? ? Renal/GU ?negative Renal ROS  ? ?IC ? ?  ?Musculoskeletal ? ?(+) Fibromyalgia - ? Abdominal ?  ?Peds ? Hematology ?negative hematology ROS ?(+)   ?Anesthesia Other Findings ?Day of surgery medications reviewed with the patient. ? Reproductive/Obstetrics ?negative OB ROS ? ?  ? ? ? ? ? ? ? ? ? ? ? ? ? ?  ?  ? ? ? ? ? ? ? ?Anesthesia Physical ?Anesthesia Plan ? ?ASA: 2 ? ?Anesthesia Plan: General  ? ?Post-op Pain Management: Tylenol PO (pre-op)*  ? ?Induction: Intravenous ? ?PONV Risk Score and Plan: 3 and Midazolam, Dexamethasone, Ondansetron, Scopolamine patch - Pre-op and Diphenhydramine ? ?Airway Management Planned: Oral ETT ? ?Additional Equipment:  ? ?Intra-op Plan:  ? ?Post-operative Plan: Extubation in OR ? ?Informed Consent: I have reviewed the patients History and Physical, chart, labs and discussed the procedure including the risks, benefits and alternatives for the proposed anesthesia with the patient or authorized representative who has indicated his/her understanding and acceptance.  ? ? ? ?Dental advisory given ? ?Plan Discussed with: CRNA ? ?Anesthesia Plan Comments:   ? ? ? ? ? ?Anesthesia Quick Evaluation ? ?

## 2021-12-18 NOTE — Op Note (Signed)
? ?Providing Compassionate, Quality Care - Together ? ?Date of service: 12/18/2021 ? 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 ?POSTOP DIAGNOSIS: Same ? ?PROCEDURE: ?1.  Right L4-5 extraforaminal minimally invasive microdiscectomy for decompression of nerve root L4 ?2. Use of operating microscope ?3. Use of intraoperative fluoroscopy ? ?SURGEON: Dr. Kendell Bane C. Aiman Noe, DO ? ?ASSISTANT: Docia Barrier, NP ? ?ANESTHESIA: General Endotracheal ? ?EBL: 20 cc ? ?SPECIMENS: None ? ?DRAINS: None ? ?COMPLICATIONS: None ? ?CONDITION: Hemodynamically stable ? ?HISTORY: ?Samantha [name] is a 51 y.o. female with complaints of right lower extremity radiculopathy in the L4 distribution as well as right-sided low back pain that began in October of last year.  She then had an extreme exacerbation of her pain, an MRI was ordered.  MRI revealed a foraminal disc protrusion causing stenosis along the L4 nerve root on the right at L4-5 as well as an extraforaminal disc protrusion at L4-5 on the right abutting the L4 nerve root.  She underwent conservative measures with pain control and epidural steroid injection, which did not help her significantly whatsoever.  Therefore I offered her an L4-5 extraforaminal discectomy via a minimally invasive approach.  We discussed all risks, benefits and expected outcomes.  We reviewed her imaging, and informed consent was obtained. ? ?PROCEDURE IN DETAIL: ?After informed consent was obtained and witnessed, the patient was brought to the operating room. After induction of general anesthesia, the patient was positioned on the operative table in the prone position with all pressure points meticulously padded. The skin of the low back was then prepped and draped in the usual sterile fashion. Physician driven timeout was performed. ? ?Under fluoroscopy, the L 4-5 level was identified and marked out on the skin, and after timeout was conducted.  Skin incision was then made sharply with a 10 blade and using tubular dilators, the lateral facet at L4-5 on the right was docked.  An appropriate sized Metrx tube was placed, and intraoperative x-ray was taken to confirm appropriate placement.  The microscope was sterilely draped and brought into the field and used for the remainder of the case. ? ?Using Bovie electrocautery, soft tissue was cleared from the lateral facet joint.  Using a high-speed drill and Kerrison rongeurs, a partial lateral facetectomy was performed.  Using bipolar cautery and Penfield 4, the intertransverse membrane was opened and the L4 nerve root was identified.  This was gently dissected with micro curettes and mobilized superiorly.  A bulging annulus was identified deep to this.  Annulotomy was performed with bipolar cautery and 11 blade.  Using a series of micro pituitaries and curettes, disc protrusion material was resected.  There were multiple large nucleus pulposus fragments removed.  The nerve root then appeared decompressed.  The nerve root was then followed more proximally along the foraminal zone and using ball-tipped probes, the free fragment of disc was removed from the foraminal region.  Epidural hemostasis was achieved with bipolar cautery and Surgifoam.  The entire length of the L4 nerve root approximated and distally was followed with a ball-tipped probe and noted to be appropriately decompressed and appeared relaxed and pulsatile. ? ?Hemostasis was then secured using a combination of morcellized Gelfoam and thrombin and bipolar electrocautery. The wound is irrigated with copious amounts of antibiotic saline irrigation.  The wound was noted to be excellently hemostatic.  The nerve root was then covered with a long-acting steroid solution and fentanyl 50 mcg.  The Metrx tube was then  removed, hemostasis was achieved with bipolar cautery and the wound is closed in layers using 2-0 Vicryl and 3-0 Vicryl stitches. The skin was  closed using standard skin glue.  Sterile dressing was applied.  Drapes were taken down. ? ?At the end of the case all sponge, needle, and instrument counts were correct. The patient was then fully transferred to the stretcher, extubated and taken to the postanesthesia care unit in stable hemodynamic condition.  ?

## 2021-12-18 NOTE — H&P (Signed)
? ? ?  Providing Compassionate, Quality Care - Together ? ?NEUROSURGERY HISTORY & PHYSICAL ? ? ?Samantha [name] is an 51 y.o. female.   ?Chief Complaint: Right lower extremity radiculopathy ?HPI: This is a pleasant 51 year old female with a history of right lower extremity radiculopathy and low back pain and was found to have a right L4-5 foraminal disc and extraforaminal disc protrusion causing L4 radiculopathy.  She unfortunately failed conservative measures including epidural steroid injection presents today for surgical intervention.  She continues to complains of pins-and-needles radiating down her right leg into her anterior lateral shin. ? ?Past Medical History:  ?Diagnosis Date  ? Fibromyalgia   ? Interstitial cystitis   ? Pulmonary embolism (HCC) 2012  ? ? ?Past Surgical History:  ?Procedure Laterality Date  ? ABDOMINAL HYSTERECTOMY  2005  ? CHOLECYSTECTOMY  2004  ? DILATION AND CURETTAGE OF UTERUS    ? FACIAL FRACTURE SURGERY  2011  ? SHOULDER ARTHROSCOPY Right 2014  ? ? ?History reviewed. No pertinent family history. ?Social History:  reports that she has never smoked. She has never used smokeless tobacco. She reports that she does not currently use alcohol. She reports that she does not use drugs. ? ?Allergies:  ?Allergies  ?Allergen Reactions  ? Azithromycin Nausea And Vomiting  ?  Any meds from [facility] - Really bad nausea and severe stomach pain   ? ? ?Medications Prior to Admission  ?Medication Sig Dispense Refill  ? dimenhyDRINATE (DRAMAMINE) 50 MG tablet Take 100 mg by mouth at bedtime.    ? diphenhydrAMINE (BENADRYL) 25 MG tablet Take 50 mg by mouth at bedtime.    ? gabapentin (NEURONTIN) 300 MG capsule Take 300 mg by mouth 3 (three) times daily.    ? oxymetazoline (AFRIN) 0.05 % nasal spray Place 1 spray into both nostrils 3 (three) times daily as needed for congestion.    ? ? ?No results found for this or any previous visit (from the past 48 hour(s)). ?No results found. ? ?ROS ?All  pertinent positives and negatives are listed in HPI above ? ?Blood pressure (!) 131/95, pulse (!) 101, temperature 97.8 ?F (36.6 ?C), temperature source Oral, resp. rate 17, height 5\' 2"  (1.575 m), weight 90.6 kg, SpO2 93 %. ?Physical Exam  ?Awake alert oriented x3, no acute distress ?PERRLA ?Speech fluent and appropriate ?Cranial nerves II through XII intact ?Full strength throughout except for right lower extremity knee extension 4+/5 ?Decreased sensation light touch in the right lower extremity L4 distribution ? ?Assessment/Plan ?51 year old female with ? ?L4-5 foraminal/extraforaminal disc protrusion with radiculopathy ? ?-OR today for right L4-5 minimally invasive extraforaminal discectomy.  We discussed all risks, benefits and expected outcomes as well as alternatives to treatment.  Informed consent was obtained.  I answered all of her questions. ? ?Thank you for allowing me to participate in this patient's care.  Please do not hesitate to call with questions or concerns. ? ? ?Markiya Keefe, DO ?Neurosurgeon ?[facility] ?Cell: [phone] ? ? ? ? ?

## 2021-12-18 NOTE — Anesthesia Postprocedure Evaluation (Signed)
Anesthesia Post Note ? ?Patient: Samantha [name] ? ?Procedure(s) Performed: MINIMALLY INVASIVE  FAR LATERAL MICRODISCECTOMY, LUMBAR FOUR- FIVE, W/METRX RIGHT (Right: Spine Lumbar) ? ?  ? ?Patient location during evaluation: PACU ?Anesthesia Type: General ?Level of consciousness: awake and alert ?Pain management: pain level controlled ?Vital Signs Assessment: post-procedure vital signs reviewed and stable ?Respiratory status: spontaneous breathing, nonlabored ventilation, respiratory function stable and patient connected to nasal cannula oxygen ?Cardiovascular status: blood pressure returned to baseline and stable ?Postop Assessment: no apparent nausea or vomiting ?Anesthetic complications: no ? ? ?No notable events documented. ? ?Last Vitals:  ?Vitals:  ? 12/18/21 1045 12/18/21 1100  ?BP: 133/87 137/81  ?Pulse: 96 98  ?Resp: 15 16  ?Temp:  36.6 ?C  ?SpO2: 93% 92%  ?  ?Last Pain:  ?Vitals:  ? 12/18/21 1100  ?TempSrc:   ?PainSc: 3   ? ? ?  ?  ?  ?  ?  ?  ? ?Collene Schlichter ? ? ? ? ?

## 2021-12-19 ENCOUNTER — Encounter (HOSPITAL_COMMUNITY): Payer: Self-pay | Admitting: Neurological Surgery

## 2022-03-28 ENCOUNTER — Other Ambulatory Visit: Payer: Self-pay | Admitting: Neurological Surgery

## 2022-03-28 DIAGNOSIS — M5127 Other intervertebral disc displacement, lumbosacral region: Secondary | ICD-10-CM
